# Patient Record
Sex: Female | Born: 1945 | Race: White | Hispanic: No | Smoking: Former smoker
Health system: Southern US, Academic
[De-identification: ages and names within clinical notes are randomized; demographics above are authoritative.]

## PROBLEM LIST (undated history)

## (undated) DIAGNOSIS — I10 Essential (primary) hypertension: Secondary | ICD-10-CM

## (undated) DIAGNOSIS — K219 Gastro-esophageal reflux disease without esophagitis: Secondary | ICD-10-CM

## (undated) DIAGNOSIS — I340 Nonrheumatic mitral (valve) insufficiency: Secondary | ICD-10-CM

## (undated) DIAGNOSIS — E785 Hyperlipidemia, unspecified: Secondary | ICD-10-CM

## (undated) DIAGNOSIS — I739 Peripheral vascular disease, unspecified: Secondary | ICD-10-CM

## (undated) HISTORY — DX: Hyperlipidemia, unspecified: E78.5

## (undated) HISTORY — PX: HX HYSTERECTOMY: SHX81

## (undated) HISTORY — PX: VENOUS THROMBECTOMY: SHX834

## (undated) HISTORY — PX: HX HIP REPLACEMENT: SHX124

## (undated) HISTORY — DX: Essential (primary) hypertension: I10

## (undated) HISTORY — DX: Nonrheumatic mitral (valve) insufficiency: I34.0

## (undated) HISTORY — PX: HX CATARACT REMOVAL: SHX102

## (undated) HISTORY — PX: HX SEPTOPLASTY: SHX37

## (undated) HISTORY — DX: Gastro-esophageal reflux disease without esophagitis: K21.9

## (undated) HISTORY — DX: Peripheral vascular disease, unspecified: I73.9

---

## 1993-10-11 ENCOUNTER — Other Ambulatory Visit (HOSPITAL_COMMUNITY): Payer: Self-pay | Admitting: Family Medicine

## 2021-04-22 ENCOUNTER — Other Ambulatory Visit (HOSPITAL_COMMUNITY): Payer: Self-pay | Admitting: Internal Medicine

## 2021-04-22 DIAGNOSIS — Z1231 Encounter for screening mammogram for malignant neoplasm of breast: Secondary | ICD-10-CM

## 2021-05-19 ENCOUNTER — Other Ambulatory Visit (INDEPENDENT_AMBULATORY_CARE_PROVIDER_SITE_OTHER): Payer: Self-pay | Admitting: NURSE PRACTITIONER

## 2021-05-19 DIAGNOSIS — I340 Nonrheumatic mitral (valve) insufficiency: Secondary | ICD-10-CM

## 2021-06-21 ENCOUNTER — Encounter (HOSPITAL_COMMUNITY): Payer: Self-pay

## 2021-06-21 ENCOUNTER — Inpatient Hospital Stay
Admission: RE | Admit: 2021-06-21 | Discharge: 2021-06-21 | Disposition: A | Discharge status: Home / Self Care | Payer: Medicare Other | Source: Ambulatory Visit | Attending: Internal Medicine | Admitting: Internal Medicine

## 2021-06-21 ENCOUNTER — Other Ambulatory Visit: Payer: Self-pay

## 2021-06-21 DIAGNOSIS — Z1231 Encounter for screening mammogram for malignant neoplasm of breast: Secondary | ICD-10-CM | POA: Insufficient documentation

## 2021-07-08 ENCOUNTER — Other Ambulatory Visit: Payer: Self-pay

## 2021-07-08 ENCOUNTER — Ambulatory Visit (INDEPENDENT_AMBULATORY_CARE_PROVIDER_SITE_OTHER): Payer: Medicare Other | Admitting: OTOLARYNGOLOGY

## 2021-07-08 ENCOUNTER — Encounter (INDEPENDENT_AMBULATORY_CARE_PROVIDER_SITE_OTHER): Payer: Self-pay | Admitting: OTOLARYNGOLOGY

## 2021-07-08 VITALS — Ht 63.0 in | Wt 105.0 lb

## 2021-07-08 DIAGNOSIS — H6123 Impacted cerumen, bilateral: Secondary | ICD-10-CM

## 2021-07-08 DIAGNOSIS — I739 Peripheral vascular disease, unspecified: Secondary | ICD-10-CM | POA: Insufficient documentation

## 2021-07-08 DIAGNOSIS — H9319 Tinnitus, unspecified ear: Secondary | ICD-10-CM

## 2021-07-08 NOTE — Procedures (Signed)
[facility], [facility]  [address]  [place] [address]    Procedure Note    Name: Lorraine Rojas MRN:  [id number]   Date: 07/08/2021 Age: 76 y.o.       [phone] - REMOVAL IMPACTED CERUMEN W/ INSTRUMENT, UNILATERAL (AMB ONLY-PD)  Performed by: Conchita Paris, DO  Authorized by: Conchita Paris, DO     Time Out:     Immediately before the procedure, a time out was called:  Yes    Patient verified:  Yes    Procedure Verified:  Yes    Site Verified:  Yes  Documentation:      Procedure: Cerumen cleaning  Pre-op Dx: Cerumen impaction      Bilateral EAC(s) examined under binocular microscopy.  Cerumen and/or debris was cleaned from the canal(s) using curettes, suction, and alligator forceps.  Patient tolerated procedure well.         Conchita Paris, DO

## 2021-07-08 NOTE — H&P (Signed)
[facility]  [facility]    Progress Note    Name: Lorraine Rojas MRN:  [id number]   Date: 07/08/2021 Age: 76 y.o.          Follow Up      Subjective:   Chief Complaint:   Hearing Loss (States having wax in ears and needing them cleaned out. Also states having decreased hearing and tinnitus.)       History of Present Illness:  Lorraine Rojas is a 76 y.o. old female who presents to the clinic for follow-up. Patient states that she has been having aural fullness left greater than right.  Patient denies otalgia or otorrhea.      Review of Systems     Physical Exam:     Vitals:    07/08/21 0850   Weight: 47.6 kg (105 lb)   Height: 1.6 m (5\' 3" )   BMI: 18.64      ENT Physical Exam  Constitutional  Appearance: patient appears well-developed, well-nourished and well-groomed,  Communication/Voice: communication appropriate for developmental age; vocal quality normal;  Head and Face  Appearance: head appears normal, face appears normal and face appears atraumatic;  Palpation: facial palpation normal;  Salivary: glands normal;  Ear  Hearing: intact;  Auricles: right auricle normal; left auricle normal;  External Mastoids: right external mastoid normal; left external mastoid normal;  Ear Canals: bilateral ear canals impacted cerumen observed;  Tympanic Membranes: right tympanic membrane normal; left tympanic membrane normal;  Nose  External Nose: nares patent bilaterally; external nose normal;  Internal Nose: nasal mucosa normal; septum normal; bilateral inferior turbinates normal;  Oral Cavity/Oropharynx  Lips: normal;  Teeth: normal;  Gums: gingiva normal;  Tongue: normal;  Oral mucosa: normal;  Hard palate: normal;  Neck  Neck: neck normal; neck palpation normal;  Thyroid: thyroid normal;  Respiratory  Inspection: breathing unlabored; normal breathing rate;  Lymphatic  Palpation: lymph nodes normal;  Neurovestibular  Mental Status: alert and oriented;  Psychiatric: mood normal; affect is appropriate;  Cranial  Nerves: cranial nerves intact;       Assessment and Plan:       ICD-10-CM    1. Tinnitus, unspecified laterality  H93.19 POCT HEARING/VISION/TYMPANOGRAM (AMB ONLY)     [facility]      2. Bilateral impacted cerumen  H61.23 [facility] - REMOVAL IMPACTED CERUMEN W/ INSTRUMENT, UNILATERAL (AMB ONLY-PD)        Orders Placed This Encounter   . [id number] - REMOVAL IMPACTED CERUMEN W/ INSTRUMENT, UNILATERAL (AMB ONLY-PD)   . Referral to AUDIOLOGY- [facility]   . POCT HEARING/VISION/TYMPANOGRAM (AMB ONLY)         Follow up:  Return for Follow up after audiogram.    [facility], DO

## 2021-09-06 ENCOUNTER — Encounter (INDEPENDENT_AMBULATORY_CARE_PROVIDER_SITE_OTHER): Payer: Self-pay | Admitting: OTOLARYNGOLOGY

## 2021-09-14 ENCOUNTER — Encounter (INDEPENDENT_AMBULATORY_CARE_PROVIDER_SITE_OTHER): Payer: Self-pay | Admitting: OTOLARYNGOLOGY

## 2021-09-14 ENCOUNTER — Other Ambulatory Visit: Payer: Self-pay

## 2021-09-14 ENCOUNTER — Ambulatory Visit (INDEPENDENT_AMBULATORY_CARE_PROVIDER_SITE_OTHER): Payer: Medicare Other | Admitting: OTOLARYNGOLOGY

## 2021-09-14 VITALS — Ht 63.0 in | Wt 105.0 lb

## 2021-09-14 DIAGNOSIS — H903 Sensorineural hearing loss, bilateral: Secondary | ICD-10-CM

## 2021-09-14 DIAGNOSIS — H6123 Impacted cerumen, bilateral: Secondary | ICD-10-CM

## 2021-09-14 DIAGNOSIS — H9319 Tinnitus, unspecified ear: Secondary | ICD-10-CM

## 2021-09-14 NOTE — Procedures (Signed)
[facility]  [address]  [place] [address]    Procedure Note    Name: Lorraine Rojas MRN:  [id number]   Date: 09/14/2021 Age: 76 y.o.       [phone] - REMOVAL IMPACTED CERUMEN W/ INSTRUMENT, UNILATERAL (AMB ONLY-PD)  Performed by: Conchita Paris, DO  Authorized by: Conchita Paris, DO     Time Out:     Immediately before the procedure, a time out was called:  Yes    Patient verified:  Yes    Procedure Verified:  Yes    Site Verified:  Yes  Documentation:      Procedure: Cerumen cleaning  Pre-op Dx: Cerumen impaction      Bilateral EAC(s) examined under binocular microscopy.  Cerumen and/or debris was cleaned from the canal(s) using curettes, suction, and alligator forceps.  Patient tolerated procedure well.         Conchita Paris, DO

## 2021-09-14 NOTE — H&P (Signed)
[facility]  [facility]    Progress Note    Name: NYASIAH MOFFET MRN:  [id number]   Date: 09/14/2021 Age: 76 y.o.          Follow Up      Subjective:   Chief Complaint:   Follow-up After Testing (Rc after audio)       History of Present Illness:  EARLYNE FEESER is a 76 y.o. old female who presents to the clinic for follow-up after audiogram.  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  Patient has unchanged audiogram from last audiogram.  No new complaints.    Review of Systems     Physical Exam:     Vitals:    09/14/21 1356   Weight: 47.6 kg (105 lb)   Height: 1.6 m (5\' 3" )   BMI: 18.64      ENT Physical Exam  Constitutional  Appearance: patient appears well-developed, well-nourished and well-groomed,  Communication/Voice: communication appropriate for developmental age; vocal quality normal;  Head and Face  Appearance: head appears normal, face appears normal and face appears atraumatic;  Palpation: facial palpation normal;  Salivary: glands normal;  Ear  Hearing: intact;  Auricles: right auricle normal; left auricle normal;  External Mastoids: right external mastoid normal; left external mastoid normal;  Ear Canals: bilateral ear canals impacted cerumen observed;  Tympanic Membranes: right tympanic membrane normal; left tympanic membrane normal;  Nose  External Nose: nares patent bilaterally; external nose normal;  Internal Nose: nasal mucosa normal; septum normal; bilateral inferior turbinates normal;  Oral Cavity/Oropharynx  Lips: normal;  Teeth: normal;  Gums: gingiva normal;  Tongue: normal;  Oral mucosa: normal;  Hard palate: normal;  Neck  Neck: neck normal; neck palpation normal;  Thyroid: thyroid normal;  Respiratory  Inspection: breathing unlabored; normal breathing rate;  Lymphatic  Palpation: lymph nodes normal;  Neurovestibular  Mental Status: alert and oriented;  Psychiatric: mood normal; affect is appropriate;  Cranial Nerves: cranial nerves intact;        Assessment and Plan:       ICD-10-CM    1. Tinnitus, unspecified laterality  H93.19       2. Bilateral impacted cerumen  H61.23 - REMOVAL IMPACTED CERUMEN W/ INSTRUMENT, UNILATERAL (AMB ONLY-PD)      3. 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 [id number] - REMOVAL IMPACTED CERUMEN W/ INSTRUMENT, UNILATERAL (AMB ONLY-PD)   Audiogram reviewed  Recommend hearing aids      Follow up:  Return in about 6 months (around 03/17/2022).    05/16/2022, DO

## 2021-11-11 ENCOUNTER — Encounter (INDEPENDENT_AMBULATORY_CARE_PROVIDER_SITE_OTHER): Payer: Self-pay | Admitting: NURSE PRACTITIONER

## 2021-11-17 ENCOUNTER — Other Ambulatory Visit: Payer: Self-pay

## 2021-11-17 ENCOUNTER — Ambulatory Visit (INDEPENDENT_AMBULATORY_CARE_PROVIDER_SITE_OTHER): Payer: Medicare Other | Admitting: NURSE PRACTITIONER

## 2021-11-17 ENCOUNTER — Encounter (INDEPENDENT_AMBULATORY_CARE_PROVIDER_SITE_OTHER): Payer: Self-pay | Admitting: NURSE PRACTITIONER

## 2021-11-17 VITALS — BP 147/62 | HR 63 | Ht 63.0 in | Wt 113.2 lb

## 2021-11-17 DIAGNOSIS — E785 Hyperlipidemia, unspecified: Secondary | ICD-10-CM

## 2021-11-17 DIAGNOSIS — I10 Essential (primary) hypertension: Secondary | ICD-10-CM

## 2021-11-17 DIAGNOSIS — R5383 Other fatigue: Secondary | ICD-10-CM

## 2021-11-17 DIAGNOSIS — I351 Nonrheumatic aortic (valve) insufficiency: Secondary | ICD-10-CM

## 2021-11-17 DIAGNOSIS — I340 Nonrheumatic mitral (valve) insufficiency: Secondary | ICD-10-CM

## 2021-11-17 NOTE — Progress Notes (Signed)
Cardiology Clinic [facility]    Name: Lorraine Rojas  Age: 76 y.o.  Date of Service: 11/17/2021    Primary Care Provider: No Pcp  Chief Complaint:   Chief Complaint   Patient presents with    Follow Up     Annual       Subjective:  The patient has no history of CAD.  Stress test and echocardiogram in February 2022 are unremarkable.  Echocardiogram showed LVH, mild LAE, EF 60 to 65% with grade 1 diastolic dysfunction.  Mild to moderate aortic regurgitation, mild PR, TR, and moderate MR.    11-13-20 The patient is here for routine f/u for valvular disease.  He denies any significant chest pains.  She continues to have shortness of breath with exertion.  She has to stop and rest when doing strenuous activity, but then she is able to continue.  No change from her baseline.  She does have lots of issues with vascular disease in her lower extremities.  She is seeing a specialist in [place].  She checks her blood pressure at home and numbers are usually stable, she has some anxiety related to checking her blood pressure so this affects her numbers sometimes. Labs July 2022 showed BUN 19, creatinine 1.1, sodium 140, potassium 3.9, total cholesterol 172, triglycerides 67, HDL 85, LDL 74.     11/17/21 The patient is here for yearly f/u.  She denies any chest pains or shortness of breath.  She does complain of some intermittent fatigue.  She states she is had this ever since she broke her hip a few years ago.  Sometimes she does well, but other times she has to stop and rest because she gets tired.  She did just get established with new PCP and has discussed her fatigue with him as well.  Lab work in January was stable showed total cholesterol 179, triglycerides 52, HDL 92, LDL 77, BUN 19, creatinine 0.97, sodium 141, potassium 4.2.      Past Medical History:  Past Medical History:   Diagnosis Date    Esophageal reflux     Essential hypertension     GERD (gastroesophageal reflux disease)     HLD (hyperlipidemia)      Mitral valve regurgitation     Peripheral vascular disease, unspecified (CMS HCC)          Social History:  Social History     Tobacco Use   Smoking Status Former    Types: Cigarettes    Quit date: 1975    Years since quitting: 48.7   Smokeless Tobacco Never      Social History     Substance and Sexual Activity   Alcohol Use Yes    Comment: holidays/special occasions      Social History     Substance and Sexual Activity   Drug Use Never      Current Medications:  Current Outpatient Medications   Medication Sig    atorvastatin (LIPITOR) 20 mg Oral Tablet 1 Tablet (20 mg total) Once a day    bethanechol chloride (URECHOLINE) 5 mg Oral Tablet Take 1 Tablet (5 mg total) by mouth Every night    calcium carbonate/vitamin D3 (CALCIUM 600 + D ORAL) Take by mouth Once a day    COQ10, UBIQUINOL, ORAL Take by mouth Once a day    dilTIAZem (CARDIZEM CD) 120 mg Oral Capsule, Sust. Release 24 hr Take 1 Capsule (120 mg total) by mouth Once a day  famotidine (PEPCID) 20 mg Oral Tablet Take 1 Tablet (20 mg total) by mouth Twice daily    losartan (COZAAR) 100 mg Oral Tablet Take 1 Tablet (100 mg total) by mouth Once a day    omeprazole (PRILOSEC) 40 mg Oral Capsule, Delayed Release(E.C.) Take 1 Capsule (40 mg total) by mouth Once a day     Allergies:  Allergies   Allergen Reactions    Biaxin [Clarithromycin]       Review of Systems:  Complete ROS was performed and otherwise negative unless noted in HPI.      Vital Signs:  Vitals:    11/17/21 0912   BP: (!) 147/62   Pulse: 63   SpO2: 98%   Weight: 51.4 kg (113 lb 4 oz)   Height: 1.6 m (5\' 3" )   BMI: 20.1      Physical Exam:  General: Pt resting comfortably in no acute distress and appears stated age.    Neck: No JVD, no carotid bruit. Neck supple, symmetrical, trachea midline.   Lungs:  Normal respiratory effort, lungs clear to auscultation bilaterally.    Cardiovascular:  Regular rate and rhythm.  Normal S1 and S2 without murmur, gallop, or rub.  Abdomen: Soft, non-tender and  bowel sounds normal.    Extremities: Extremities normal, atraumatic, no cyanosis or edema.    Neurologic: Alert and oriented x3.       Assessment:    Mitral valve insufficiency, unspecified etiology    Fatigue, unspecified type    Aortic insufficiency    Hypertension, unspecified type    Hyperlipidemia, unspecified hyperlipidemia type      Plan:   Overall stable.  Her symptoms of fatigue do not sound cardiac at this point.  Recent lab work was stable.  Will repeat echo, this was ordered for prior to this visit, but did not transfer into the new system.  As long as this is stable, return in 1 year for routine f/u.      Orders placed this visit:  Orders Placed This Encounter    EKG (In-Clinic Today)    TRANSTHORACIC ECHOCARDIOGRAM - ADULT       Lorraine Rojas is to return to clinic for follow up with the understanding that should symptoms change or worsen she is to call the office or go to the closest emergency department for evaluation.    , APRN,FNP-BC    A portion of this documentation may have been generated using MMODAL voice recognition software and may contain syntax/voice recognition errors.

## 2021-11-18 ENCOUNTER — Telehealth (INDEPENDENT_AMBULATORY_CARE_PROVIDER_SITE_OTHER): Payer: Self-pay | Admitting: INTERVENTIONAL CARDIOLOGY

## 2021-11-18 NOTE — Telephone Encounter (Signed)
Pt called and informed that her ECHO is scheduled for Oct.3,2023 at 100pm at [facility].Pt voiced understanding.

## 2021-12-07 ENCOUNTER — Other Ambulatory Visit: Payer: Self-pay

## 2021-12-07 ENCOUNTER — Inpatient Hospital Stay
Admission: RE | Admit: 2021-12-07 | Discharge: 2021-12-07 | Disposition: A | Discharge status: Home / Self Care | Payer: Medicare Other | Source: Ambulatory Visit | Attending: NURSE PRACTITIONER | Admitting: NURSE PRACTITIONER

## 2021-12-07 DIAGNOSIS — I340 Nonrheumatic mitral (valve) insufficiency: Secondary | ICD-10-CM | POA: Insufficient documentation

## 2021-12-27 ENCOUNTER — Encounter (INDEPENDENT_AMBULATORY_CARE_PROVIDER_SITE_OTHER): Payer: Self-pay | Admitting: OTOLARYNGOLOGY

## 2022-01-12 LAB — ECG W INTERP (AMB USE ONLY)(MUSE,IN CLINIC)
Atrial Rate: 65 {beats}/min
Calculated P Axis: 53 degrees
Calculated R Axis: 78 degrees
Calculated T Axis: 71 degrees
PR Interval: 156 ms
QRS Duration: 78 ms
QT Interval: 422 ms
QTC Calculation: 438 ms
Ventricular rate: 65 {beats}/min

## 2022-02-17 ENCOUNTER — Encounter (INDEPENDENT_AMBULATORY_CARE_PROVIDER_SITE_OTHER): Payer: Self-pay | Admitting: Internal Medicine

## 2022-03-01 ENCOUNTER — Ambulatory Visit (INDEPENDENT_AMBULATORY_CARE_PROVIDER_SITE_OTHER): Payer: Medicare Other | Admitting: OTOLARYNGOLOGY

## 2022-03-01 ENCOUNTER — Other Ambulatory Visit: Payer: Self-pay

## 2022-03-01 ENCOUNTER — Encounter (INDEPENDENT_AMBULATORY_CARE_PROVIDER_SITE_OTHER): Payer: Self-pay | Admitting: OTOLARYNGOLOGY

## 2022-03-01 VITALS — Ht 63.0 in | Wt 113.0 lb

## 2022-03-01 DIAGNOSIS — H6123 Impacted cerumen, bilateral: Secondary | ICD-10-CM

## 2022-03-01 DIAGNOSIS — K219 Gastro-esophageal reflux disease without esophagitis: Secondary | ICD-10-CM

## 2022-03-01 DIAGNOSIS — H903 Sensorineural hearing loss, bilateral: Secondary | ICD-10-CM

## 2022-03-01 DIAGNOSIS — H9319 Tinnitus, unspecified ear: Secondary | ICD-10-CM

## 2022-03-01 NOTE — H&P (Signed)
[facility]  [facility]    Progress Note    Name: Lorraine Rojas MRN:  [id number]   Date: 03/01/2022 Age: 76 y.o.          Follow Up      Subjective:   Chief Complaint:   Follow Up 6 Months (6 month rc on ears. States having wax in ears that is causing decreased hearing)       History of Present Illness:  Lorraine Rojas is a 76 y.o. old female who presents to the clinic for follow-up. Patient states that she has been having some cerumen build up with decreased hearing. She is still having tinnitus. Patient states that reflux is well controlled.  Review of Systems     Physical Exam:     Vitals:    03/01/22 0828   Weight: 51.3 kg (113 lb)   Height: 1.6 m (5\' 3" )   BMI: 20.06      ENT Physical Exam  Constitutional  Appearance: patient appears well-developed, well-nourished and well-groomed,  Communication/Voice: communication appropriate for developmental age; vocal quality normal;  Head and Face  Appearance: head appears normal, face appears normal and face appears atraumatic;  Palpation: facial palpation normal;  Salivary: glands normal;  Ear  Hearing: intact;  Auricles: right auricle normal; left auricle normal;  External Mastoids: right external mastoid normal; left external mastoid normal;  Ear Canals: bilateral ear canals impacted cerumen observed;  Tympanic Membranes: right tympanic membrane normal; left tympanic membrane normal;  Nose  External Nose: nares patent bilaterally; external nose normal;  Internal Nose: nasal mucosa normal; septum normal; bilateral inferior turbinates normal;  Oral Cavity/Oropharynx  Lips: normal;  Teeth: normal;  Gums: gingiva normal;  Tongue: normal;  Oral mucosa: normal;  Hard palate: normal;  Neck  Neck: neck normal; neck palpation normal;  Thyroid: thyroid normal;  Respiratory  Inspection: breathing unlabored; normal breathing rate;  Lymphatic  Palpation: lymph nodes normal;  Neurovestibular  Mental Status: alert and oriented;  Psychiatric: mood normal; affect is  appropriate;  Cranial Nerves: cranial nerves intact;       Assessment and Plan:       ICD-10-CM    1. Tinnitus, unspecified laterality  H93.19       2. Bilateral impacted cerumen  H61.23 - REMOVAL IMPACTED CERUMEN W/ INSTRUMENT, UNILATERAL (AMB ONLY-PD)      3. Sensorineural hearing loss (SNHL) of both ears  H90.3       4. 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  Cerumen removed  Patient is considering hearing aids.      Follow up:  Return in about 4 months (around 07/01/2022).    07/03/2022, DO

## 2022-03-01 NOTE — Procedures (Signed)
[facility]  [address]  [place] [address]    Procedure Note    Name: Lorraine Rojas MRN:  [id number]   Date: 03/01/2022 Age: 76 y.o.  DOB:   Aug 02, 1945       26378 - REMOVAL IMPACTED CERUMEN W/ INSTRUMENT, UNILATERAL (AMB ONLY-PD)    Performed by: Conchita Paris, DO  Authorized by: Conchita Paris, DO    Time Out:     Immediately before the procedure, a time out was called:  Yes    Patient verified:  Yes    Procedure Verified:  Yes    Site Verified:  Yes  Documentation:      Procedure: Cerumen cleaning  Pre-op Dx: Cerumen impaction      Bilateral EAC(s) examined under binocular microscopy.  Cerumen and/or debris was cleaned from the canal(s) using curettes, suction, and alligator forceps.  Patient tolerated procedure well.        Conchita Paris, DO

## 2022-03-14 ENCOUNTER — Encounter (INDEPENDENT_AMBULATORY_CARE_PROVIDER_SITE_OTHER): Payer: Self-pay | Admitting: OTOLARYNGOLOGY

## 2022-03-20 ENCOUNTER — Other Ambulatory Visit: Payer: Self-pay

## 2022-03-20 ENCOUNTER — Encounter (HOSPITAL_BASED_OUTPATIENT_CLINIC_OR_DEPARTMENT_OTHER): Payer: Self-pay

## 2022-03-20 ENCOUNTER — Emergency Department
Admission: EM | Admit: 2022-03-20 | Discharge: 2022-03-20 | Disposition: A | Discharge status: Home / Self Care | Payer: Medicare Other | Attending: Emergency Medicine | Admitting: Emergency Medicine

## 2022-03-20 DIAGNOSIS — Z87891 Personal history of nicotine dependence: Secondary | ICD-10-CM | POA: Insufficient documentation

## 2022-03-20 DIAGNOSIS — I83892 Varicose veins of left lower extremities with other complications: Secondary | ICD-10-CM | POA: Insufficient documentation

## 2022-03-20 DIAGNOSIS — I83899 Varicose veins of unspecified lower extremities with other complications: Secondary | ICD-10-CM

## 2022-03-20 DIAGNOSIS — Z4801 Encounter for change or removal of surgical wound dressing: Secondary | ICD-10-CM | POA: Insufficient documentation

## 2022-03-20 DIAGNOSIS — Z5189 Encounter for other specified aftercare: Secondary | ICD-10-CM

## 2022-03-20 MED ORDER — SILVER NITRATE APPLICATORS 75 %-25 % TOPICAL STICK
CUTANEOUS | Status: AC
Start: 2022-03-20 — End: 2022-03-20
  Filled 2022-03-20: qty 1

## 2022-03-20 MED ORDER — HYDROCODONE 5 MG-ACETAMINOPHEN 325 MG TABLET
2.0000 | ORAL_TABLET | ORAL | Status: AC
Start: 2022-03-20 — End: 2022-03-20
  Administered 2022-03-20: 2 via ORAL

## 2022-03-20 MED ORDER — HYDROCODONE 5 MG-ACETAMINOPHEN 325 MG TABLET
ORAL_TABLET | ORAL | Status: AC
Start: 2022-03-20 — End: 2022-03-20
  Filled 2022-03-20: qty 2

## 2022-03-20 MED ORDER — SILVER NITRATE APPLICATORS 75 %-25 % TOPICAL STICK
1.0000 | CUTANEOUS | Status: AC
Start: 2022-03-20 — End: 2022-03-20
  Administered 2022-03-20: 1 via TOPICAL

## 2022-03-20 NOTE — Discharge Instructions (Signed)
KEEP PRESSURE BANDAGE FOR 8-12 HOURS

## 2022-03-20 NOTE — ED Attending Note (Addendum)
[facility] emergency department         HISTORY OF PRESENT ILLNESS     Date:  03/20/2022  Patient's Name:  Lorraine Rojas  Date of Birth:  11/17/1945    PATIENT IS A 76 YEAR WITH HISTORY OF BIOPSY LEFT FOOT 2 WEEKS NOW WITH BLEEDING FROM THE WOUND SITE ONSET TONIGHT AFTER TAKING OFF HER SOCK NO OTHER INJURY        Review of Systems     Review of Systems   Constitutional: Negative.    HENT: Negative.     Cardiovascular: Negative.    Gastrointestinal: Negative.    Genitourinary: Negative.    Musculoskeletal: Negative.    Neurological: Negative.    Hematological: Negative.    Psychiatric/Behavioral: Negative.     All other systems reviewed and are negative.      Previous History     Past Medical History:  Past Medical History:   Diagnosis Date    Esophageal reflux     Essential hypertension     GERD (gastroesophageal reflux disease)     HLD (hyperlipidemia)     Mitral valve regurgitation     Peripheral vascular disease, unspecified (CMS HCC)        Past Surgical History:  Past Surgical History:   Procedure Laterality Date    Hx hysterectomy         Social History:  Social History     Tobacco Use    Smoking status: Former     Types: Cigarettes     Quit date: 1975     Years since quitting: 49.0    Smokeless tobacco: Never   Vaping Use    Vaping Use: Never used   Substance Use Topics    Alcohol use: Yes     Comment: holidays/special occasions    Drug use: Never     Social History     Substance and Sexual Activity   Drug Use Never       Family History:  Family History   Problem Relation Age of Onset    Diabetes Mother     Heart Attack Father     No Known Problems Sister     No Known Problems Brother     No Known Problems Maternal Aunt     No Known Problems Maternal Uncle     No Known Problems Paternal Aunt     No Known Problems Paternal Uncle     No Known Problems Maternal Grandmother     No Known Problems Maternal Grandfather     No Known Problems Paternal Grandmother     No  Known Problems Paternal Grandfather     No Known Problems Daughter     No Known Problems Son     No Known Problems Other        Medication History:  Current Outpatient Medications   Medication Sig    atorvastatin (LIPITOR) 20 mg Oral Tablet 1 Tablet (20 mg total) Once a day    bethanechol chloride (URECHOLINE) 5 mg Oral Tablet Take 1 Tablet (5 mg total) by mouth Every night    calcium carbonate/vitamin D3 (CALCIUM 600 + D ORAL) Take by mouth Once a day    COQ10, UBIQUINOL, ORAL Take by mouth Once a day    dilTIAZem (CARDIZEM CD) 120 mg Oral Capsule, Sust. Release 24 hr Take 1 Capsule (120 mg total) by mouth Once a day    famotidine (PEPCID) 20 mg Oral  Tablet Take 1 Tablet (20 mg total) by mouth Twice daily    losartan (COZAAR) 100 mg Oral Tablet Take 1 Tablet (100 mg total) by mouth Once a day    omeprazole (PRILOSEC) 40 mg Oral Capsule, Delayed Release(E.C.) Take 1 Capsule (40 mg total) by mouth Once a day       Allergies:  Allergies   Allergen Reactions    Biaxin [Clarithromycin]        Physical Exam     Vitals:    BP (!) 143/69   Pulse 87   Temp 36.4 C (97.6 F)   Resp 20   Ht 1.6 m (5\' 3" )   Wt 47.6 kg (105 lb)   SpO2 100%   BMI 18.60 kg/m           Physical Exam  Vitals and nursing note reviewed.   Constitutional:       General: She is not in acute distress.     Appearance: She is well-developed.   HENT:      Head: Normocephalic and atraumatic.   Eyes:      Conjunctiva/sclera: Conjunctivae normal.   Cardiovascular:      Rate and Rhythm: Normal rate and regular rhythm.      Heart sounds: No murmur heard.  Pulmonary:      Effort: Pulmonary effort is normal. No respiratory distress.      Breath sounds: Normal breath sounds.   Abdominal:      Palpations: Abdomen is soft.      Tenderness: There is no abdominal tenderness.   Musculoskeletal:         General: No swelling.      Cervical back: Neck supple.        Feet:    Feet:      Comments: BLEEDING BIOPSY SITE AREA  Skin:     General: Skin is warm and dry.       Capillary Refill: Capillary refill takes less than 2 seconds.   Neurological:      General: No focal deficit present.      Mental Status: She is alert and oriented to person, place, and time.   Psychiatric:         Mood and Affect: Mood normal.         Diagnostic Studies/Treatment     Medications:  Medications Administered in the ED   silver nitrate applicators 16-10% stick (1 Stick Apply Topically Given 03/20/22 0300)   HYDROcodone-acetaminophen (NORCO) 5-325 mg per tablet (2 Tablets Oral Given 03/20/22 0222)       New Prescriptions    No medications on file       Labs:    No results found for this or any previous visit (from the past 12 hour(s)).     Radiology:  None    No orders to display       ECG:  NONE            Differential diagnosis  BLEEDING BIOPSY SITE, WOUND CHECK    Course/Disposition/Plan     Course:     ED Course as of 03/20/22 0226   Sun Mar 20, 2022   0204 PATIENT WITH A SMALL AREA ON HER LEFT FOOT VARICOSE VEIN ACTIVELY BLEEDING.  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

## 2022-03-20 NOTE — ED Triage Notes (Signed)
Pt states she had a biopsy done on top of left foot a few weeks ago and tonight she was removing sock and ripped scab off and heavy bleeding reported. Pt states blood shot out and they finally got it slowed down with "blood stop powder" and bandage, but unsure if this bandage will hold it. Denies any new injury.

## 2022-03-20 NOTE — ED Nurses Note (Signed)
Pt verbalized that pain is improved. Current pain rated at a 2/10 on the pain scale. Pt states that she is comfortable with discharge at this time. DC instructions discussed with verbal teach back to confirm understanding. Pt dc'd to home with friend via wheelchair.

## 2022-03-20 NOTE — ED Nurses Note (Signed)
Pt left food dressed with nonadherent gauze, kerlex, and coband. Caprefill good. Pt can feel toes adequately. Plan of care ongoing at this time.

## 2022-06-20 IMAGING — MR MRI SHOULDER RT W/O CONTRAST
6 series · 39 of 40 positions shown · non-contrast
Comparison: None available.

﻿EXAM:  [id number]   MRI SHOULDER RT W/O CONTRAST
INDICATION: Right shoulder pain, osteoarthritis, weakness, impingement, several falls recently.
TECHNIQUE: Noncontrast multiplanar, multisequence MRI was performed.

[Series 6: T1 · oblique · right · 3.5mm · 0.33mm/px · 7 of 18 slices shown]
[im 1/18]
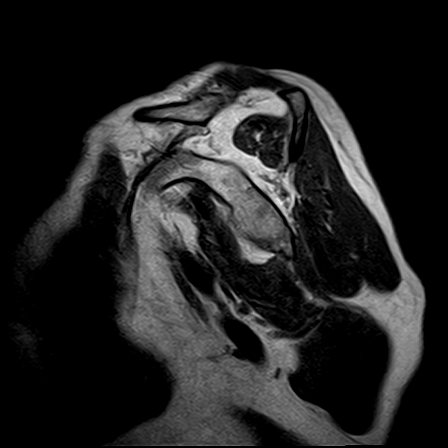
[im 3/18]
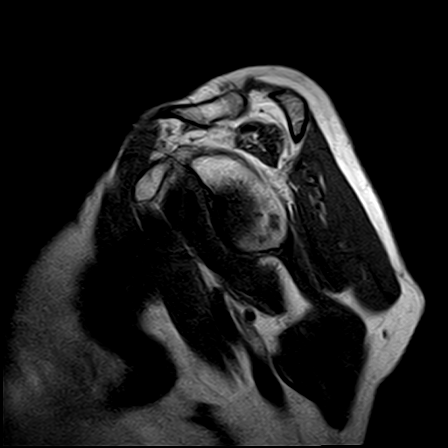
[im 6/18]
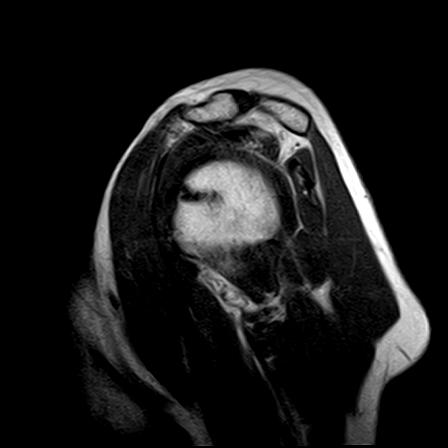
[im 9/18]
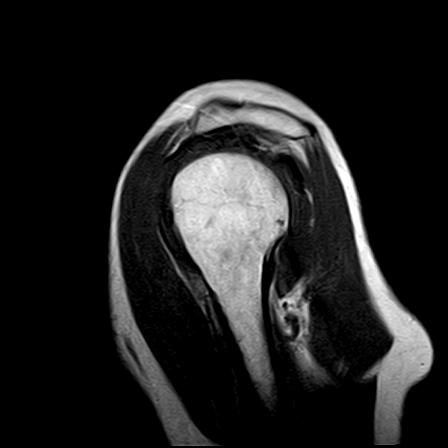
[im 12/18]
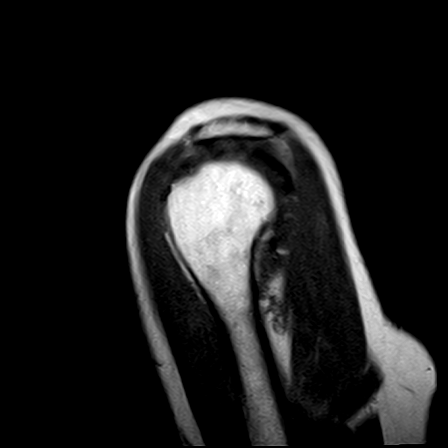
[im 15/18]
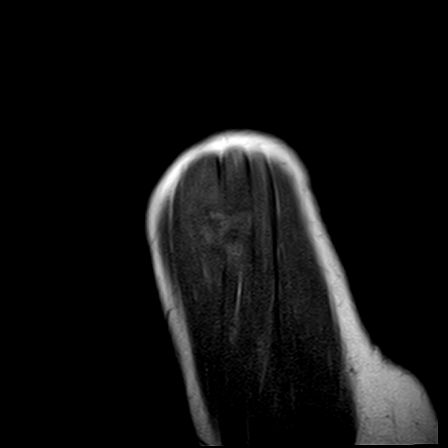
[im 18/18]
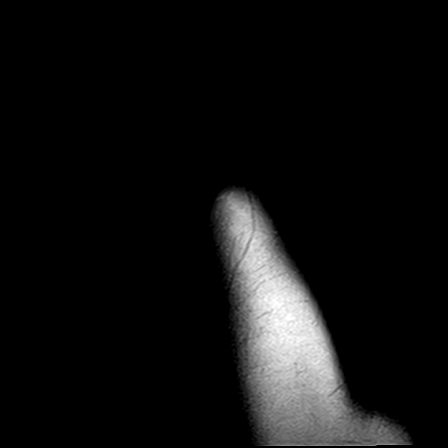

[Series 7: STIR · oblique · right · 3.5mm · 0.47mm/px · 7 of 18 slices shown (1 of 2)]
[im 1/18]
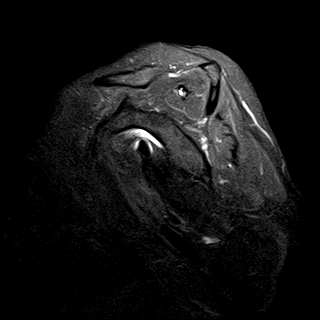
[im 3/18]
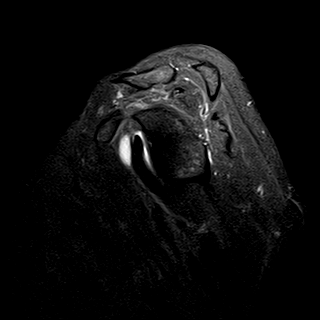
[im 6/18]
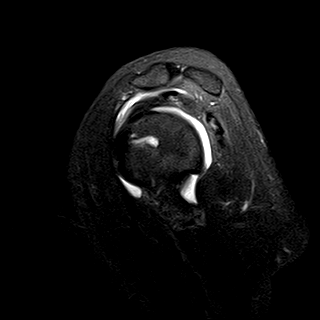
[im 9/18]
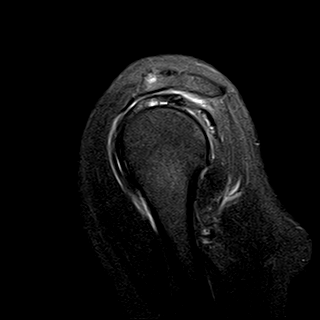
[im 12/18]
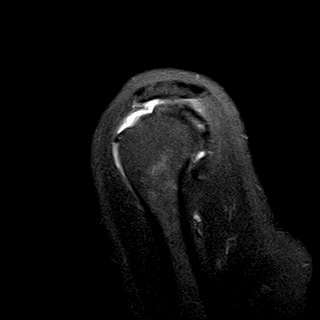
[im 15/18]
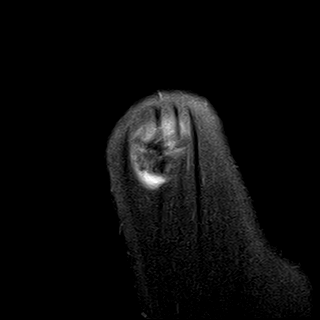
[im 18/18]
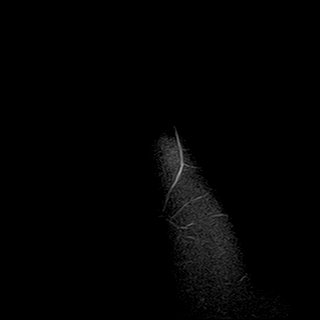

[Series 8: PD fat-sat · axial · right · 4.0mm · 0.50mm/px · z∈[-9,+67]mm · 6 of 18 slices shown (1 of 2)]
[im 1/18]
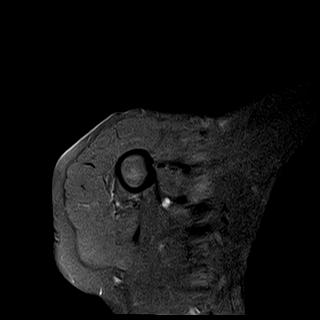
[im 4/18]
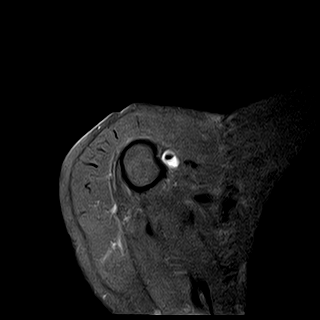
[im 7/18]
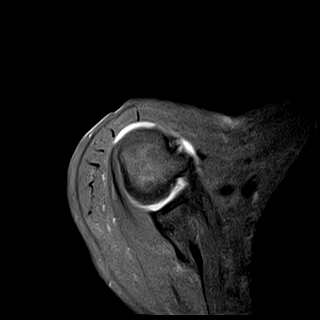
[im 11/18]
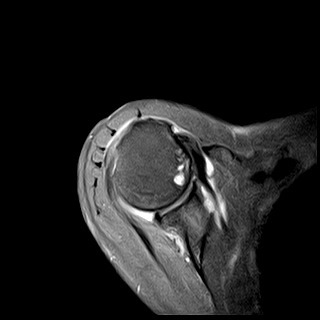
[im 14/18]
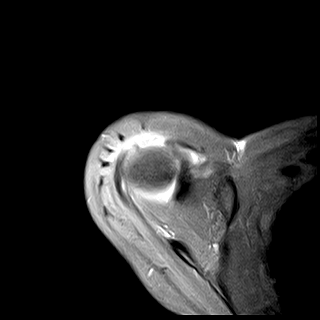
[im 18/18]
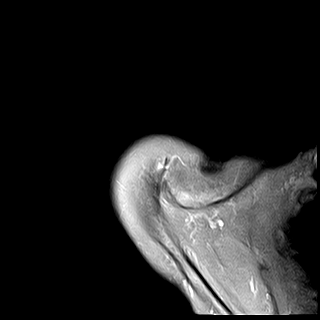

[Series 9: T2 fat-sat · axial · right · 4.0mm · 0.42mm/px · z∈[-22,+80]mm · 8 of 24 slices shown]
[im 1/24]
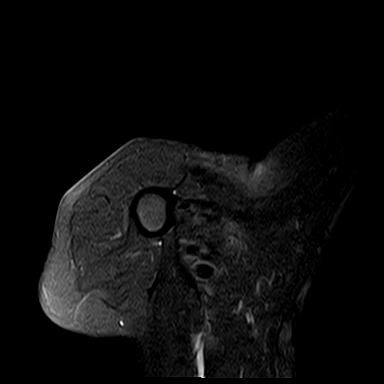
[im 4/24]
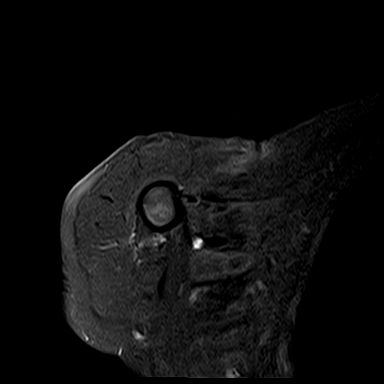
[im 7/24]
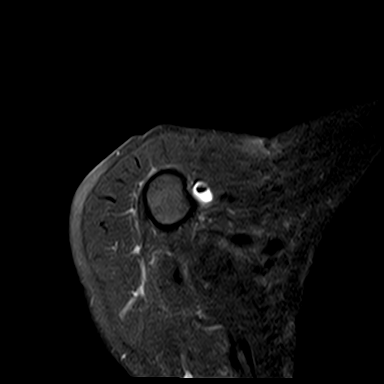
[im 10/24]
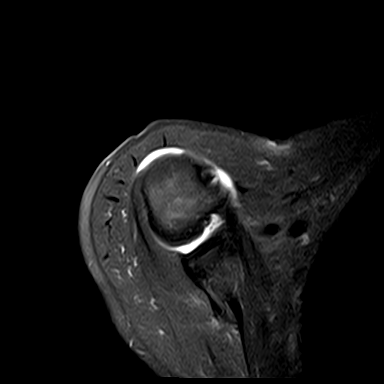
[im 14/24]
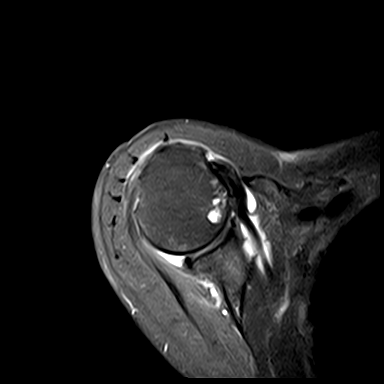
[im 17/24]
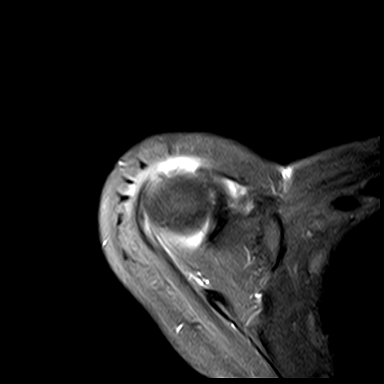
[im 20/24]
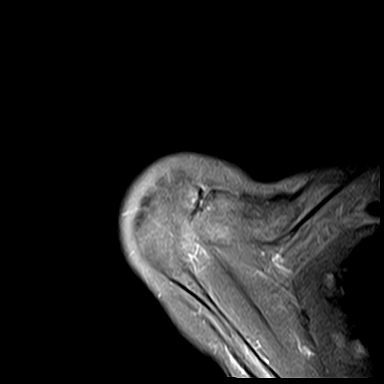
[im 24/24]
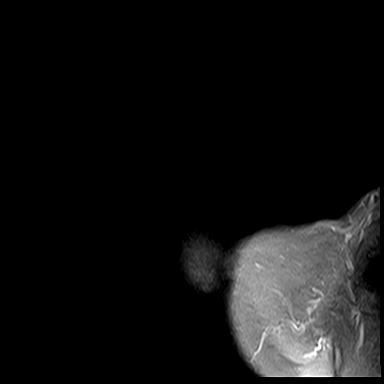

[Series 10: PD fat-sat · oblique · right · 3.5mm · 0.47mm/px · 6 of 18 slices shown (2 of 2)]
[im 1/18]
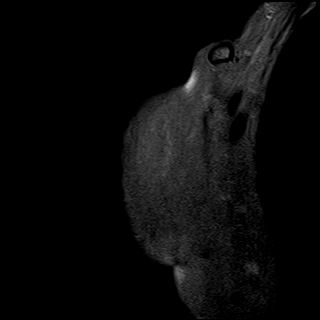
[im 4/18]
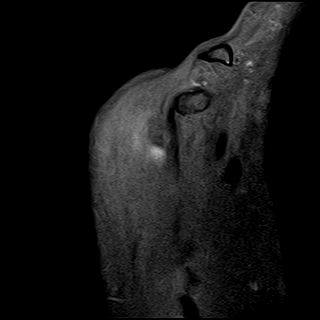
[im 7/18]
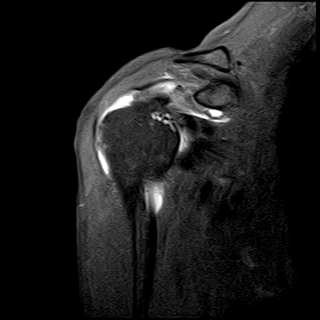
[im 11/18]
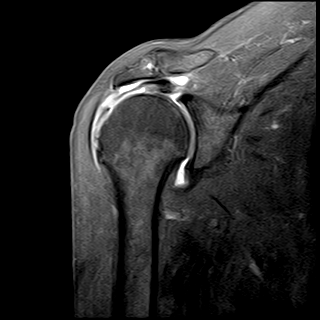
[im 14/18]
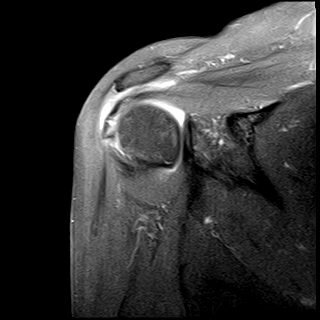
[im 18/18]
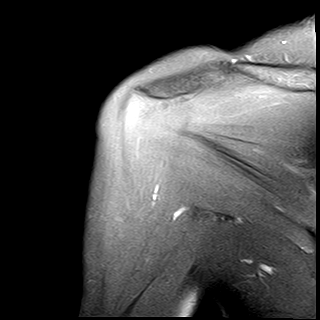

[Series 11: STIR · oblique · right · 3.5mm · 0.47mm/px · 5 of 18 slices shown (2 of 2)]
[im 1/18]
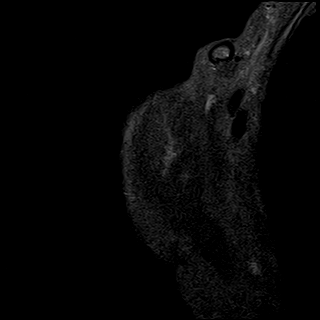
[im 4/18]
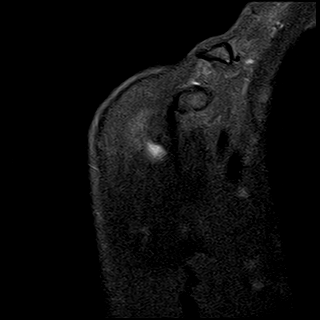
[im 7/18]
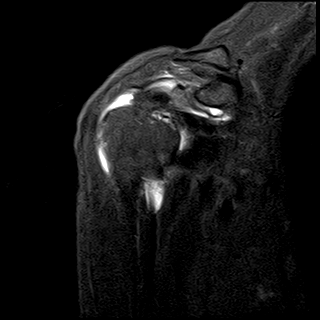
[im 11/18]
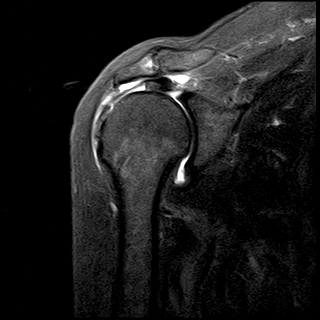
[im 14/18]
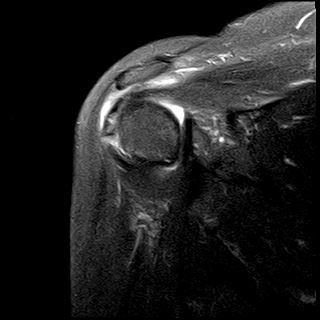

[39 of 40 positions shown; findings below may reference images not displayed]

FINDINGS: There is a small amount of fluid in the glenohumeral joint and in the subacromial-subdeltoid bursa.  

There is a complete tear of the supraspinatus tendon with approximately 3 cm of retraction.  There is also a high-grade, subtotal tear of the infraspinatus tendon.  

The subacromial space is reduced, and the acromion is down sloping.  

There is subscapularis tendinosis as well as mild tendinosis involving the biceps tendon.  

The glenoid labrum is intact.  There is degenerative cyst formation in the humeral head.  There is no fracture or dislocation.  Mild degenerative changes are noted involving the acromioclavicular joint.
IMPRESSION: Massive rotator cuff tear.

## 2022-07-01 ENCOUNTER — Other Ambulatory Visit (HOSPITAL_COMMUNITY): Payer: Self-pay | Admitting: Family Medicine

## 2022-07-01 DIAGNOSIS — Z1231 Encounter for screening mammogram for malignant neoplasm of breast: Secondary | ICD-10-CM

## 2022-07-04 ENCOUNTER — Ambulatory Visit (INDEPENDENT_AMBULATORY_CARE_PROVIDER_SITE_OTHER): Payer: Medicare Other | Admitting: OTOLARYNGOLOGY

## 2022-07-04 ENCOUNTER — Other Ambulatory Visit: Payer: Self-pay

## 2022-07-04 ENCOUNTER — Encounter (INDEPENDENT_AMBULATORY_CARE_PROVIDER_SITE_OTHER): Payer: Self-pay | Admitting: OTOLARYNGOLOGY

## 2022-07-04 VITALS — Ht 63.0 in | Wt 105.0 lb

## 2022-07-04 DIAGNOSIS — K219 Gastro-esophageal reflux disease without esophagitis: Secondary | ICD-10-CM

## 2022-07-04 DIAGNOSIS — H9319 Tinnitus, unspecified ear: Secondary | ICD-10-CM

## 2022-07-04 DIAGNOSIS — H6123 Impacted cerumen, bilateral: Secondary | ICD-10-CM

## 2022-07-04 DIAGNOSIS — H903 Sensorineural hearing loss, bilateral: Secondary | ICD-10-CM

## 2022-07-04 DIAGNOSIS — J300 Vasomotor rhinitis: Secondary | ICD-10-CM

## 2022-07-04 MED ORDER — IPRATROPIUM BROMIDE 21 MCG (0.03 %) NASAL SPRAY
2.0000 | Freq: Two times a day (BID) | NASAL | 4 refills | Status: AC | PRN
Start: 2022-07-04 — End: ?

## 2022-07-04 NOTE — Procedures (Signed)
[facility]  [address]  [place] [address]    Procedure Note    Name: TYLIA EWELL MRN:  [id number]   Date: 07/04/2022 DOB:  09-15-1945 (76 y.o.)         [phone] - REMOVAL IMPACTED CERUMEN W/ INSTRUMENT, UNILATERAL (AMB ONLY-PD)    Performed by: Conchita Paris, DO  Authorized by: Conchita Paris, DO    Time Out:     Immediately before the procedure, a time out was called:  Yes    Patient verified:  Yes    Procedure Verified:  Yes    Site Verified:  Yes  Documentation:      Procedure: Cerumen cleaning  Pre-op Dx: Cerumen impaction      Bilateral EAC(s) examined under binocular microscopy.  Cerumen and/or debris was cleaned from the canal(s) using curettes, suction, and alligator forceps.  Patient tolerated procedure well.    56213 - LARYNGOSCOPY, FLEXIBLE DIAGNOSTIC (AMB ONLY)    Performed by: Conchita Paris, DO  Authorized by: Conchita Paris, DO    Time Out:     Immediately before the procedure, a time out was called:  Yes    Patient verified:  Yes    Procedure Verified:  Yes    Site Verified:  Yes  Documentation:      Indications for procedure: GERD / LPR management    Anesthesia: Oxymetazoline nasal spray    Description: The flexible endoscope was gently introduced into the nostril and passed along the floor of the nose to the nasopharynx. Adenoid was minimal and eustachian tubes normal. The retropalatal airway was patent.    The endoscope was passed to the oropharynx. Base of tongue displayed normal lingual tonsils, patent valelulla, and sharply defined upright epiglottis. Retrolingual airway was patent.    The larynx displayed normal true vocal cords with good mobility. False cords were normal. Arytenoid mucosa was pink with no edema.     The piriform recesses were symmetric without secretion. The hypopharynx was symmetric without lesion.    Findings: Laryngopharyngeal Reflux    The patient tolerated the procedure well.                 Conchita Paris, DO

## 2022-07-04 NOTE — H&P (Signed)
[facility]  [address]  [place] [address]    Return Patient Visit    Name: Lorraine Rojas MRN:  [id number]   Date: 07/04/2022 DOB: 1945-11-20 (76 y.o.)       Referring Provider:  No ref. provider found    Reason for Visit:   Chief Complaint   Patient presents with    Follow Up 4 Months     4 month rc on ears. States no complaints noted.        History of Present Illness:  Lorraine Rojas is a 77 y.o. female who is FU on ears. Her ears feel a little clogged. She c/o a runny nose with meals. Tolerating Pepcid and omeprazole which work very well.       Patient History:  Patient Active Problem List   Diagnosis    Peripheral vascular disease, unspecified (CMS HCC)     Current Outpatient Medications   Medication Sig    atorvastatin (LIPITOR) 20 mg Oral Tablet 1 Tablet (20 mg total) Once a day    calcium carbonate/vitamin D3 (CALCIUM 600 + D ORAL) Take by mouth Once a day    COQ10, UBIQUINOL, ORAL Take by mouth Once a day    dilTIAZem (CARDIZEM CD) 120 mg Oral Capsule, Sust. Release 24 hr Take 1 Capsule (120 mg total) by mouth Once a day    famotidine (PEPCID) 20 mg Oral Tablet Take 1 Tablet (20 mg total) by mouth Twice daily    ipratropium bromide (ATROVENT) 21 mcg (0.03 %) Nasal nasal spray Administer 2 Sprays into affected nostril(s) Twice per day as needed    losartan (COZAAR) 100 mg Oral Tablet Take 1 Tablet (100 mg total) by mouth Once a day    omeprazole (PRILOSEC) 40 mg Oral Capsule, Delayed Release(E.C.) Take 1 Capsule (40 mg total) by mouth Once a day      Allergies   Allergen Reactions    Biaxin [Clarithromycin]      Past Medical History:   Diagnosis Date    Esophageal reflux     Essential hypertension     GERD (gastroesophageal reflux disease)     HLD (hyperlipidemia)     Mitral valve regurgitation     Peripheral vascular disease, unspecified (CMS HCC)      Past Surgical History:   Procedure Laterality Date    HX HYSTERECTOMY       Family Medical History:       Problem Relation (Age  of Onset)    Diabetes Mother    Heart Attack Father    No Known Problems Sister, Brother, Maternal Aunt, Maternal Uncle, Paternal Aunt, Paternal Uncle, Maternal Grandmother, Maternal Grandfather, Paternal Grandmother, Paternal Grandfather, Daughter, Son, Other            Social History     Tobacco Use    Smoking status: Former     Current packs/day: 0.00     Types: Cigarettes     Quit date: 1975     Years since quitting: 49.3    Smokeless tobacco: Never   Vaping Use    Vaping status: Never Used   Substance Use Topics    Alcohol use: Yes     Comment: holidays/special occasions    Drug use: Never       Review of Systems:  Review of Systems    Physical Exam:  Ht 1.6 m (5\' 3" )   Wt 47.6 kg (105 lb)   BMI 18.60 kg/m  ENT Physical Exam  Constitutional  Appearance: patient appears well-developed, well-nourished and well-groomed,  Communication/Voice: communication appropriate for developmental age; vocal quality normal;  Head and Face  Appearance: head appears normal, face appears normal and face appears atraumatic;  Palpation: facial palpation normal;  Salivary: glands normal;  Ear  Hearing: intact;  Auricles: right auricle normal; left auricle normal;  External Mastoids: right external mastoid normal; left external mastoid normal;  Ear Canals: bilateral ear canals impacted cerumen observed;  Tympanic Membranes: right tympanic membrane normal; left tympanic membrane normal;  Nose  External Nose: nares patent bilaterally; external nose normal;  Internal Nose: nasal mucosa normal; septum normal; bilateral inferior turbinates normal;  Oral Cavity/Oropharynx  Lips: normal;  Teeth: normal;  Gums: gingiva normal;  Tongue: normal;  Oral mucosa: normal;  Hard palate: normal;  Neck  Neck: neck normal; neck palpation normal;  Thyroid: thyroid normal;  Respiratory  Inspection: breathing unlabored; normal breathing rate;  Lymphatic  Palpation: lymph nodes normal;  Neurovestibular  Mental Status: alert and  oriented;  Psychiatric: mood normal; affect is appropriate;  Cranial Nerves: cranial nerves intact;       Assessment:  ENCOUNTER DIAGNOSES     ICD-10-CM   1. Tinnitus, unspecified laterality  H93.19   2. Bilateral impacted cerumen  H61.23   3. 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  AU cerumen debrided. Cont omeprazole and Pepcid, stable. Rx Atrovent 0.03%.   Orders Placed This Encounter    [phone] - REMOVAL IMPACTED CERUMEN W/ INSTRUMENT, UNILATERAL (AMB ONLY-PD)    31575 - LARYNGOSCOPY, FLEXIBLE DIAGNOSTIC (AMB ONLY)    ipratropium bromide (ATROVENT) 21 mcg (0.03 %) Nasal nasal spray     Return in about 6 months (around 01/03/2023).    Marcelline Deist, PA-C  The advanced practice clinician's documentation was reviewed/amended in its entirety with the assessment and plan portion completely performed independently by me during this separate encounter.

## 2022-07-14 ENCOUNTER — Encounter (HOSPITAL_COMMUNITY): Payer: Self-pay

## 2022-07-14 ENCOUNTER — Other Ambulatory Visit: Payer: Self-pay

## 2022-07-14 ENCOUNTER — Inpatient Hospital Stay
Admission: RE | Admit: 2022-07-14 | Discharge: 2022-07-14 | Disposition: A | Discharge status: Home / Self Care | Payer: Medicare Other | Source: Ambulatory Visit | Attending: Family Medicine

## 2022-07-14 DIAGNOSIS — Z1231 Encounter for screening mammogram for malignant neoplasm of breast: Secondary | ICD-10-CM | POA: Insufficient documentation

## 2022-07-20 ENCOUNTER — Other Ambulatory Visit (HOSPITAL_COMMUNITY): Payer: Self-pay | Admitting: Orthopaedic Surgery

## 2022-07-20 DIAGNOSIS — R0989 Other specified symptoms and signs involving the circulatory and respiratory systems: Secondary | ICD-10-CM

## 2022-08-08 ENCOUNTER — Other Ambulatory Visit: Payer: Self-pay

## 2022-08-08 ENCOUNTER — Inpatient Hospital Stay
Admission: RE | Admit: 2022-08-08 | Discharge: 2022-08-08 | Disposition: A | Discharge status: Home / Self Care | Payer: Medicare Other | Source: Ambulatory Visit | Attending: Orthopaedic Surgery | Admitting: Orthopaedic Surgery

## 2022-08-08 DIAGNOSIS — R0989 Other specified symptoms and signs involving the circulatory and respiratory systems: Secondary | ICD-10-CM | POA: Insufficient documentation

## 2022-09-12 ENCOUNTER — Other Ambulatory Visit: Payer: Self-pay

## 2022-09-12 ENCOUNTER — Other Ambulatory Visit (HOSPITAL_COMMUNITY): Payer: Self-pay | Admitting: Orthopaedic Surgery

## 2022-09-12 ENCOUNTER — Other Ambulatory Visit: Payer: Medicare Other | Attending: Orthopaedic Surgery

## 2022-09-12 ENCOUNTER — Inpatient Hospital Stay (HOSPITAL_COMMUNITY)
Admission: RE | Admit: 2022-09-12 | Discharge: 2022-09-12 | Disposition: A | Discharge status: Home / Self Care | Payer: Medicare Other | Source: Ambulatory Visit | Attending: Orthopaedic Surgery

## 2022-09-12 DIAGNOSIS — Z01818 Encounter for other preprocedural examination: Secondary | ICD-10-CM

## 2022-09-12 LAB — BASIC METABOLIC PANEL
ANION GAP: 7 mmol/L (ref 4–13)
BUN/CREA RATIO: 20 (ref 6–22)
BUN: 18 mg/dL (ref 7–25)
CALCIUM: 9.8 mg/dL (ref 8.6–10.3)
CHLORIDE: 104 mmol/L (ref 98–107)
CO2 TOTAL: 27 mmol/L (ref 21–31)
CREATININE: 0.91 mg/dL (ref 0.60–1.30)
ESTIMATED GFR: 65 mL/min/{1.73_m2} (ref >59)
GLUCOSE: 123 mg/dL — ABNORMAL HIGH (ref 74–109)
OSMOLALITY, CALCULATED: 279 mOsm/kg (ref 270–290)
POTASSIUM: 4.1 mmol/L (ref 3.5–5.1)
SODIUM: 138 mmol/L (ref 136–145)

## 2022-09-12 LAB — URINALYSIS, MACROSCOPIC
BILIRUBIN: NEGATIVE mg/dL
BLOOD: NEGATIVE mg/dL
GLUCOSE: NEGATIVE mg/dL
KETONES: NEGATIVE mg/dL
LEUKOCYTES: NEGATIVE WBCs/uL
NITRITE: NEGATIVE
PH: 7 (ref 5.0–9.0)
PROTEIN: NEGATIVE mg/dL
SPECIFIC GRAVITY: 1.012 (ref 1.002–1.030)
UROBILINOGEN: NORMAL mg/dL

## 2022-09-12 LAB — CBC
HCT: 38.9 % (ref 31.2–41.9)
HGB: 13.3 g/dL (ref 10.9–14.3)
MCH: 30.8 pg (ref 24.7–32.8)
MCHC: 34.3 g/dL (ref 32.3–35.6)
MCV: 89.9 fL (ref 75.5–95.3)
MPV: 8.2 fL (ref 7.9–10.8)
PLATELETS: 296 10*3/uL (ref 140–440)
RBC: 4.33 10*6/uL (ref 3.63–4.92)
RDW: 13.4 % (ref 12.3–17.7)
WBC: 6 10*3/uL (ref 3.8–11.8)

## 2022-09-12 LAB — URINALYSIS, MICROSCOPIC
RBCS: 1 /hpf (ref <4)
SQUAMOUS EPITHELIAL: <1 /hpf (ref <28)

## 2022-09-14 DIAGNOSIS — Z01818 Encounter for other preprocedural examination: Secondary | ICD-10-CM

## 2022-09-14 DIAGNOSIS — M75101 Unspecified rotator cuff tear or rupture of right shoulder, not specified as traumatic: Secondary | ICD-10-CM

## 2022-09-14 LAB — ECG 12 LEAD
Atrial Rate: 65 {beats}/min
Calculated P Axis: 57 degrees
Calculated R Axis: 81 degrees
Calculated T Axis: 78 degrees
PR Interval: 148 ms
QRS Duration: 80 ms
QT Interval: 406 ms
QTC Calculation: 422 ms
Ventricular rate: 65 {beats}/min

## 2022-09-30 ENCOUNTER — Encounter (HOSPITAL_COMMUNITY): Admission: RE | Payer: Self-pay | Source: Ambulatory Visit

## 2022-09-30 ENCOUNTER — Inpatient Hospital Stay: Admission: RE | Admit: 2022-09-30 | Payer: Medicare Other | Source: Ambulatory Visit | Admitting: Orthopaedic Surgery

## 2022-09-30 SURGERY — ARTHROSCOPY SHOULDER
Site: Shoulder | Laterality: Right

## 2022-10-10 ENCOUNTER — Other Ambulatory Visit: Payer: Self-pay

## 2022-10-10 ENCOUNTER — Other Ambulatory Visit: Payer: Medicare Other | Attending: Orthopaedic Surgery

## 2022-10-10 DIAGNOSIS — Z01818 Encounter for other preprocedural examination: Secondary | ICD-10-CM | POA: Insufficient documentation

## 2022-10-10 LAB — URINALYSIS, MICROSCOPIC
RBCS: 1 /hpf (ref <4)
WBCS: <1 /hpf (ref <6)

## 2022-10-10 LAB — CBC WITH DIFF
BASOPHIL #: 0 10*3/uL (ref 0.00–0.10)
BASOPHIL %: 0 % (ref 0–1)
EOSINOPHIL #: 0.1 10*3/uL (ref 0.00–0.50)
EOSINOPHIL %: 2 % (ref 1–7)
HCT: 37.6 % (ref 31.2–41.9)
HGB: 13 g/dL (ref 10.9–14.3)
LYMPHOCYTE #: 0.9 10*3/uL — ABNORMAL LOW (ref 1.00–3.00)
LYMPHOCYTE %: 15 % — ABNORMAL LOW (ref 16–44)
MCH: 30.8 pg (ref 24.7–32.8)
MCHC: 34.5 g/dL (ref 32.3–35.6)
MCV: 89.4 fL (ref 75.5–95.3)
MONOCYTE #: 0.6 10*3/uL (ref 0.30–1.00)
MONOCYTE %: 9 % (ref 5–13)
MPV: 8.2 fL (ref 7.9–10.8)
NEUTROPHIL #: 4.6 10*3/uL (ref 1.85–7.80)
NEUTROPHIL %: 74 % (ref 43–77)
PLATELETS: 356 10*3/uL (ref 140–440)
RBC: 4.21 10*6/uL (ref 3.63–4.92)
RDW: 12.9 % (ref 12.3–17.7)
WBC: 6.2 10*3/uL (ref 3.8–11.8)

## 2022-10-10 LAB — URINALYSIS, MACROSCOPIC
BILIRUBIN: NEGATIVE mg/dL
BLOOD: NEGATIVE mg/dL
GLUCOSE: NEGATIVE mg/dL
KETONES: NEGATIVE mg/dL
LEUKOCYTES: 25 WBCs/uL — AB
NITRITE: NEGATIVE
PH: 7 (ref 5.0–9.0)
PROTEIN: NEGATIVE mg/dL
SPECIFIC GRAVITY: 1.014 (ref 1.002–1.030)
UROBILINOGEN: NORMAL mg/dL

## 2022-10-10 LAB — BASIC METABOLIC PANEL
ANION GAP: 5 mmol/L (ref 4–13)
BUN/CREA RATIO: 20 (ref 6–22)
BUN: 17 mg/dL (ref 7–25)
CALCIUM: 9.5 mg/dL (ref 8.6–10.3)
CHLORIDE: 106 mmol/L (ref 98–107)
CO2 TOTAL: 27 mmol/L (ref 21–31)
CREATININE: 0.85 mg/dL (ref 0.60–1.30)
ESTIMATED GFR: 70 mL/min/{1.73_m2} (ref >59)
GLUCOSE: 89 mg/dL (ref 74–109)
OSMOLALITY, CALCULATED: 277 mOsm/kg (ref 270–290)
POTASSIUM: 3.9 mmol/L (ref 3.5–5.1)
SODIUM: 138 mmol/L (ref 136–145)

## 2022-10-12 LAB — URINE CULTURE: URINE CULTURE: NO GROWTH

## 2022-10-27 NOTE — H&P (Signed)
[facility] [address] [place], [place] [address] [phone]  Name: Lorraine Rojas DOB: 1945/06/10 Gender: F MR#: [id number]  Provider: Alexia Freestone, MD,FAAOS  Encounter Date: 07/20/22  Referral:   Care team: Nelle Don, Primary,  COVID VISIT INFO: Modified OV due to COVID restrictions, limited contact exam, VS not recorded, Patient not candidate for TELE visit. PPE and precautions used to minimize risk of transmission.  CHIEF COMPLAINT  Right Shoulder pain; 07/20/22; PJB  HISTORY  OA right shoulder with RCT/impingement: 77 year old female presents with continued right shoulder pain/weakness for several years increasing in the last several months. States pain is anterior activity related with range of motion, internal rotation, lifting objects away from the body, nighttime pain. D Notes increase in symptoms after sev eral falls in the last couple of months.. Conservative care includes activity modification, ice, heat, NSAIDs, steroid injection, and HEP with no relief in symptoms. MRI shows large rotator cuff tear with retraction and moderate fatty infiltratio n of the supraspinatus. Minimal arthritic change.  PAST MEDICAL HISTORY  Denies history of cancer Cardiac: Hypertension and Hyperlipidemia Heart murmur GI history: Barrett Esophagus, GERD w/o esophagitis and Hiatal hernia Treatment: N/A By: Iron deficiency Musculoskeletal: Denies OA, gout and other common musculoskeletal problems. Rheumatologic: Denies N/A Carotid stenosis. Peripheral vascular disease  PAST SURGICAL HISTORY  Date Side/ Site Procedure Details Doctor Facility Complications 08/20/19 Bilateral Eye cataracts none 10/17/18 Left Hip hemiarthroplasty Bipolar, Dr. Leighton Parody MD, [facility]  1  Lower extremity-Hip 08/19/81 partial hysterectomy 08/20/79 tubal ligation  [facility]  none none  ALLERGIES  Name Reactions Notes Biaxin Diarrhea thimerosal Burning Eye burning latex Irritation/redness Eye  burning  MEDICATIONS  Name Cartia XT 120 mg 1 oral qd ------Calcium Channel Blockers - Benzothiazepines Cozaar 100 mg 1 oral qd ------Angiotensin II Receptor Blockers (ARBs) Lipitor 20 mg 1 oral qd ------Antihyperlipidemic - HMG CoA Reductase Inhibitors (statins) ------INTERACTIONS: Drug-drug interaction to Cartia XT; Pepcid AC 10 mg 1 2 oral bid 2019-07-19 ------Gastric Acid Secretion Reducers - Histamine H2-Receptor Antagonists CoQ10 SG 100 100-100 mg-unit 1 oral qd 2019-07-19 ------Alternative Therapy - Unclassified Multi Vitamin 9 mg iron/15 mL 1 oral qd 2019-07-19 ------Multiple Vitamins and Mineral Combinations Fiber Therapy 500 mg 1 oral qd 2019-07-19 ------Laxative - Bulk Forming Prilosec 40 mg 1 oral qd 2019-07-19 ------Gastric Acid Secretion Reducing Agents - Proton Pump Inhibitors (PPIs) Urecholine 5 mg 1 oral qd 2019-07-19 ------Urinary Retention Therapy - Parasympathomimetic Agents Natural Potassium 99 mg 1 oral qd 2019-07-19 ------Minerals & Electrolytes - Potassium, Oral ------INTERACTIONS: Drug-drug interaction to Cozaar; Strength Thompson Grayer 2019-07-19 Date 2019-07-19 2019-07-19  Family and Social History  Patient is Married. 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 No brothers; No sisters. Diabetes in mother. Hypertension in mother and father MI in father and Grandfather Cholesterol Grandmother  2  Stroke in father Neurologic : inmother N/A, mother, No known family history of Bleeding, Blood clots  REVIEW OF SYSTEMS  General: No history of change in weight, fever, chills, change in energy, fatigue or changes in sleep habits. Opthalmic: Negative for change in vision / eye problems. ENT: Denies congestion, sinus, throat and hearing issues. Respiratory: Denies SOB, cough and other respiratory symptoms. Cardiac: Denies chest pain, CHF and other cardiac  symptoms. GI: No changes in bowel habits, abdominal pain or other GI complaint. No history of prior pregnancy. Musculoskeletal: Denies significant pain, joint or muscular  symptoms. Neurological: Denies cephalgia, seizures, weakness, numbness and other neurologic symptoms. Endocrine: Denies polyuria, polydypsia and other endocrine related complaints. Behavioral: Denies significant mood and psychological symptoms. Hemeatologic: Denies anemia, bleeding / clotting problems and other blood related complaints. Stop Bang interpretation / modified: Low probability of OSA: Score 1 RCRI, no risk factors, risk of cardiac death, nonfatal MI and cardiac arrest, 0.4%. Risk of MI, Pulmonary edema, ventricular fib, cardiac arrest and complete heart block 0.5% POUR / IPSS score 0. Score classified as mild level of symptoms, with no or mild increase in risk of urinary retention. Charlson score: Enter PMFSH then select calculate score resulting in estimated in hospital mortality rate of 1.2%  EXAM  Right shoulder: Full passive range of motion. Significant weakness in initiation of abduction negative drop arm and external rotation lag signs. Neurologic exam is normal. Circulation is intact.  Increase cervical lordosis protracted posture of the shoulders. AC joint is nontender.  Patient has left carotid bruit. General exam   BMI   Cardiac, respiratory and mental status NORMAL  X-RAYS  Date Name Review 06/20/2022 R Shoulder MR Rotator cuff: Retracted tear. Acromioclavicular joint degenerative change. Biceps long head normal. Labrum: normal. Ligaments: Normal. Synovial: No significant effusion, synovitis or loose bodies noted . Arfthritis: Mild glenohumeral degenerative change. Massive rotator cuff tear.  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 Patient states due to failed conservative care and severity and length of symptoms and declining quality of life she wishes to proceed with surgical interventions. Discussed the risks , benefits, complications, and expected outcomes all in detail including the risk of infection and neurovascular compromise. Will obtain carotid study to evaluate left carotid bruit. Preoperative medical clearance with PCP.  Orders:   Date Name Review 07/20/2022 Carotid ultrasound N/A 06/20/2022 R Shoulder MR Rotator cuff: Retracted tear. Acromioclavicular joint degenerative change. Biceps long head normal. Labrum: normal. Ligaments: Normal. Synovial: No significant effusion, synovitis or loose bodies noted . Arfthritis: Mild glenohumeral degenerative change. Massive rotator cuff tear. 04/12/2022 R Shoulder XR 2V Right Shoulder Primary osteoarthritis Grade Kelligren Lawrence grade 2: Slight narrowing of joint space, minor bone changes consistent with arthritis. 08/20/2019 L HIP PELVIS 2 view Left Hip. Bipolar hip arthroplasty. . 12/06/2018 L HIP PELVIS 2 view Left Hip. Bipolar hip arthroplasty. The patient is status post left total hip arthroplasty in the interval between examinations. The prosthetic components appear to be in anatomic position. 10/16/2018 L HIP PELVIS 2 view Left Hip. Proximal femur positive for Fracturefracture. 1. Left subcapital femoral neck fracture with moderate varus deformity. 2. The rest of the left femur; pelvic bony frame work and right hip joint are unremarkable. 10/16/2018 L Femur XR 2V L FEM Radiograph: 1. Left subcapital femoral neck fracture with moderate varus deformity. 2. The rest of the left femur; pelvic bony frame work and right hip joint are unremarkable.  Date Name Review 08/20/2019  LSSP L HIP PT EVAL TX N/A  RETURN / FOLLOW-UP  Right shoulder rotator cuff repair, subacromial decompression scheduled  Alexia Freestone, MD,FAAOS  Waldron Session, M.D. 07/20/2022 05:16:50  [id number]  V13  4  5    Attestation:  Patient has been examined before  anesthesia, and there are no changes in medical status or indications for surgery.

## 2022-10-28 ENCOUNTER — Ambulatory Visit (HOSPITAL_COMMUNITY): Payer: Medicare Other | Admitting: Anesthesiology

## 2022-10-28 ENCOUNTER — Emergency Department (HOSPITAL_COMMUNITY): Payer: Medicare Other

## 2022-10-28 ENCOUNTER — Ambulatory Visit
Admission: RE | Admit: 2022-10-28 | Discharge: 2022-10-28 | Disposition: A | Discharge status: Home / Self Care | Payer: Medicare Other | Source: Ambulatory Visit | Attending: Orthopaedic Surgery | Admitting: Orthopaedic Surgery

## 2022-10-28 ENCOUNTER — Encounter (HOSPITAL_COMMUNITY): Payer: Medicare Other | Admitting: Orthopaedic Surgery

## 2022-10-28 ENCOUNTER — Encounter (HOSPITAL_COMMUNITY)
Admission: RE | Disposition: A | Discharge status: Home / Self Care | Payer: Self-pay | Source: Ambulatory Visit | Attending: Orthopaedic Surgery

## 2022-10-28 ENCOUNTER — Encounter (HOSPITAL_COMMUNITY): Payer: Self-pay | Admitting: Orthopaedic Surgery

## 2022-10-28 ENCOUNTER — Emergency Department (EMERGENCY_DEPARTMENT_HOSPITAL)
Admission: EM | Admit: 2022-10-28 | Discharge: 2022-10-28 | Discharge status: Against Medical Advice | Payer: Medicare Other | Source: Home / Self Care | Attending: Emergency Medicine | Admitting: Emergency Medicine

## 2022-10-28 ENCOUNTER — Other Ambulatory Visit: Payer: Self-pay

## 2022-10-28 DIAGNOSIS — M539 Dorsopathy, unspecified: Secondary | ICD-10-CM | POA: Insufficient documentation

## 2022-10-28 DIAGNOSIS — M94211 Chondromalacia, right shoulder: Secondary | ICD-10-CM | POA: Insufficient documentation

## 2022-10-28 DIAGNOSIS — S43491A Other sprain of right shoulder joint, initial encounter: Secondary | ICD-10-CM | POA: Insufficient documentation

## 2022-10-28 DIAGNOSIS — I739 Peripheral vascular disease, unspecified: Secondary | ICD-10-CM | POA: Insufficient documentation

## 2022-10-28 DIAGNOSIS — G9341 Metabolic encephalopathy: Secondary | ICD-10-CM | POA: Insufficient documentation

## 2022-10-28 DIAGNOSIS — Z5329 Procedure and treatment not carried out because of patient's decision for other reasons: Secondary | ICD-10-CM | POA: Insufficient documentation

## 2022-10-28 DIAGNOSIS — R9431 Abnormal electrocardiogram [ECG] [EKG]: Secondary | ICD-10-CM | POA: Insufficient documentation

## 2022-10-28 DIAGNOSIS — I10 Essential (primary) hypertension: Secondary | ICD-10-CM | POA: Insufficient documentation

## 2022-10-28 DIAGNOSIS — S46111A Strain of muscle, fascia and tendon of long head of biceps, right arm, initial encounter: Secondary | ICD-10-CM | POA: Insufficient documentation

## 2022-10-28 DIAGNOSIS — T402X1A Poisoning by other opioids, accidental (unintentional), initial encounter: Secondary | ICD-10-CM | POA: Insufficient documentation

## 2022-10-28 DIAGNOSIS — Z87891 Personal history of nicotine dependence: Secondary | ICD-10-CM | POA: Insufficient documentation

## 2022-10-28 DIAGNOSIS — J9601 Acute respiratory failure with hypoxia: Secondary | ICD-10-CM | POA: Insufficient documentation

## 2022-10-28 DIAGNOSIS — E785 Hyperlipidemia, unspecified: Secondary | ICD-10-CM | POA: Insufficient documentation

## 2022-10-28 DIAGNOSIS — M75111 Incomplete rotator cuff tear or rupture of right shoulder, not specified as traumatic: Secondary | ICD-10-CM | POA: Insufficient documentation

## 2022-10-28 DIAGNOSIS — K219 Gastro-esophageal reflux disease without esophagitis: Secondary | ICD-10-CM | POA: Insufficient documentation

## 2022-10-28 DIAGNOSIS — T40601A Poisoning by unspecified narcotics, accidental (unintentional), initial encounter: Secondary | ICD-10-CM

## 2022-10-28 DIAGNOSIS — R519 Headache, unspecified: Secondary | ICD-10-CM | POA: Insufficient documentation

## 2022-10-28 DIAGNOSIS — Z79899 Other long term (current) drug therapy: Secondary | ICD-10-CM | POA: Insufficient documentation

## 2022-10-28 LAB — ECG 12 LEAD
Calculated P Axis: 56 degrees
Calculated R Axis: 20 degrees
Calculated T Axis: 60 degrees
QRS Duration: 88 ms

## 2022-10-28 LAB — POC BLOOD GLUCOSE (RESULTS): GLUCOSE, POC: 174 mg/dl — ABNORMAL HIGH (ref 70–100)

## 2022-10-28 SURGERY — ARTHROSCOPY SHOULDER
Anesthesia: General | Site: Shoulder | Laterality: Right | Wound class: Clean Wound: Uninfected operative wounds in which no inflammation occurred

## 2022-10-28 MED ORDER — SODIUM CHLORIDE 0.9 % (FLUSH) INJECTION SYRINGE
3.0000 mL | INJECTION | Freq: Three times a day (TID) | INTRAMUSCULAR | Status: DC
Start: 2022-10-28 — End: 2022-10-28

## 2022-10-28 MED ORDER — APREPITANT 40 MG CAPSULE
ORAL_CAPSULE | ORAL | Status: AC
Start: 2022-10-28 — End: 2022-10-28
  Filled 2022-10-28: qty 1

## 2022-10-28 MED ORDER — PROPOFOL 10 MG/ML IV BOLUS
INJECTION | Freq: Once | INTRAVENOUS | Status: DC | PRN
Start: 2022-10-28 — End: 2022-10-28
  Administered 2022-10-28: 110 mg via INTRAVENOUS

## 2022-10-28 MED ORDER — DEXTROSE 5 % AND LACTATED RINGERS INTRAVENOUS SOLUTION
INTRAVENOUS | Status: DC
Start: 2022-10-28 — End: 2022-10-28

## 2022-10-28 MED ORDER — MORPHINE 10 MG/ML INJECTION WRAPPER
Freq: Once | INTRAVENOUS | Status: DC | PRN
Start: 2022-10-28 — End: 2022-10-28

## 2022-10-28 MED ORDER — ONDANSETRON HCL (PF) 4 MG/2 ML INJECTION SOLUTION
INTRAMUSCULAR | Status: AC
Start: 2022-10-28 — End: 2022-10-28
  Filled 2022-10-28: qty 4

## 2022-10-28 MED ORDER — SODIUM CHLORIDE 0.9 % (FLUSH) INJECTION SYRINGE
3.0000 mL | INJECTION | INTRAMUSCULAR | Status: DC | PRN
Start: 2022-10-28 — End: 2022-10-28

## 2022-10-28 MED ORDER — ACETAMINOPHEN 325 MG TABLET
975.0000 mg | ORAL_TABLET | Freq: Once | ORAL | Status: AC
Start: 2022-10-28 — End: 2022-10-28
  Administered 2022-10-28: 975 mg via ORAL

## 2022-10-28 MED ORDER — EPHEDRINE SULFATE 50 MG/ML INTRAVENOUS SOLUTION
Freq: Once | INTRAVENOUS | Status: DC | PRN
Start: 2022-10-28 — End: 2022-10-28
  Administered 2022-10-28 (×3): 10 mg via INTRAVENOUS

## 2022-10-28 MED ORDER — LACTATED RINGERS INTRAVENOUS SOLUTION
INTRAVENOUS | Status: DC
Start: 2022-10-28 — End: 2022-10-28

## 2022-10-28 MED ORDER — OXYCODONE-ACETAMINOPHEN 5 MG-325 MG TABLET
1.0000 | ORAL_TABLET | ORAL | Status: DC | PRN
Start: 2022-10-28 — End: 2022-10-28
  Administered 2022-10-28: 1 via ORAL
  Filled 2022-10-28: qty 1

## 2022-10-28 MED ORDER — ALBUTEROL SULFATE 2.5 MG/3 ML (0.083 %) SOLUTION FOR NEBULIZATION
2.5000 mg | INHALATION_SOLUTION | Freq: Once | RESPIRATORY_TRACT | Status: DC | PRN
Start: 2022-10-28 — End: 2022-10-28

## 2022-10-28 MED ORDER — DEXAMETHASONE SODIUM PHOSPHATE (PF) 10 MG/ML INJECTION SOLUTION
10.0000 mg | Freq: Once | INTRAMUSCULAR | Status: AC
Start: 2022-10-28 — End: 2022-10-28
  Administered 2022-10-28: 10 mg via INTRAVENOUS

## 2022-10-28 MED ORDER — FAMOTIDINE (PF) 20 MG/2 ML INTRAVENOUS SOLUTION
20.0000 mg | Freq: Once | INTRAVENOUS | Status: AC
Start: 2022-10-28 — End: 2022-10-28
  Administered 2022-10-28: 20 mg via INTRAVENOUS

## 2022-10-28 MED ORDER — FENTANYL (PF) 50 MCG/ML INJECTION SOLUTION
INTRAMUSCULAR | Status: AC
Start: 2022-10-28 — End: 2022-10-28
  Filled 2022-10-28: qty 2

## 2022-10-28 MED ORDER — MIDAZOLAM 5 MG/ML INJECTION WRAPPER
INTRAMUSCULAR | Status: AC
Start: 2022-10-28 — End: 2022-10-28
  Filled 2022-10-28: qty 1

## 2022-10-28 MED ORDER — ONDANSETRON HCL (PF) 4 MG/2 ML INJECTION SOLUTION
8.0000 mg | Freq: Once | INTRAMUSCULAR | Status: AC
Start: 2022-10-28 — End: 2022-10-28
  Administered 2022-10-28: 8 mg via INTRAVENOUS

## 2022-10-28 MED ORDER — NALOXONE 0.4 MG/ML INJECTION SOLUTION
0.4000 mg | INTRAMUSCULAR | Status: DC | PRN
Start: 2022-10-28 — End: 2022-10-28

## 2022-10-28 MED ORDER — HYDRALAZINE 20 MG/ML INJECTION SOLUTION
INTRAMUSCULAR | Status: AC
Start: 2022-10-28 — End: 2022-10-28
  Filled 2022-10-28: qty 1

## 2022-10-28 MED ORDER — HYDROCODONE 7.5 MG-ACETAMINOPHEN 325 MG TABLET
1.0000 | ORAL_TABLET | Freq: Four times a day (QID) | ORAL | 0 refills | Status: AC | PRN
Start: 2022-10-28 — End: ?

## 2022-10-28 MED ORDER — ROPIVACAINE (PF) 2 MG/ML (0.2 %) INJECTION SOLUTION
INTRAMUSCULAR | Status: AC
Start: 2022-10-28 — End: 2022-10-28
  Filled 2022-10-28: qty 20

## 2022-10-28 MED ORDER — APREPITANT 40 MG CAPSULE
40.0000 mg | ORAL_CAPSULE | Freq: Once | ORAL | Status: AC
Start: 2022-10-28 — End: 2022-10-28
  Administered 2022-10-28: 40 mg via ORAL

## 2022-10-28 MED ORDER — CEFAZOLIN 2 GRAM INTRAVENOUS SOLUTION
INTRAVENOUS | Status: AC
Start: 2022-10-28 — End: 2022-10-28
  Filled 2022-10-28: qty 14.71

## 2022-10-28 MED ORDER — MIDAZOLAM 5 MG/ML INJECTION WRAPPER
0.5000 mg | Freq: Once | INTRAMUSCULAR | Status: DC | PRN
Start: 2022-10-28 — End: 2022-10-28
  Administered 2022-10-28: 0.5 mg via INTRAVENOUS

## 2022-10-28 MED ORDER — ONDANSETRON HCL (PF) 4 MG/2 ML INJECTION SOLUTION
4.0000 mg | Freq: Once | INTRAMUSCULAR | Status: DC | PRN
Start: 2022-10-28 — End: 2022-10-28

## 2022-10-28 MED ORDER — IPRATROPIUM 0.5 MG-ALBUTEROL 3 MG (2.5 MG BASE)/3 ML NEBULIZATION SOLN
3.0000 mL | INHALATION_SOLUTION | Freq: Once | RESPIRATORY_TRACT | Status: DC | PRN
Start: 2022-10-28 — End: 2022-10-28

## 2022-10-28 MED ORDER — LACTATED RINGERS INTRAVENOUS SOLUTION
INTRAVENOUS | Status: DC
Start: 2022-10-28 — End: 2022-10-28
  Administered 2022-10-28: 0 via INTRAVENOUS

## 2022-10-28 MED ORDER — NAPROXEN 250 MG TABLET
ORAL_TABLET | ORAL | Status: AC
Start: 2022-10-28 — End: 2022-10-28
  Filled 2022-10-28: qty 2

## 2022-10-28 MED ORDER — OXYCODONE ER 10 MG TABLET,CRUSH RESISTANT,EXTENDED RELEASE 12 HR
10.0000 mg | EXTENDED_RELEASE_ORAL_TABLET | Freq: Once | ORAL | Status: AC
Start: 2022-10-28 — End: 2022-10-28
  Administered 2022-10-28: 10 mg via ORAL

## 2022-10-28 MED ORDER — ROCURONIUM 10 MG/ML INTRAVENOUS SOLUTION
Freq: Once | INTRAVENOUS | Status: DC | PRN
Start: 2022-10-28 — End: 2022-10-28
  Administered 2022-10-28: 40 mg via INTRAVENOUS

## 2022-10-28 MED ORDER — MORPHINE 10 MG/ML INJECTION WRAPPER
INTRAVENOUS | Status: AC
Start: 2022-10-28 — End: 2022-10-28
  Filled 2022-10-28: qty 2

## 2022-10-28 MED ORDER — CELECOXIB 100 MG CAPSULE
200.0000 mg | ORAL_CAPSULE | Freq: Once | ORAL | Status: DC
Start: 2022-10-28 — End: 2022-10-28

## 2022-10-28 MED ORDER — CEFAZOLIN 2 GRAM INTRAVENOUS SOLUTION
2.0000 g | Freq: Once | INTRAVENOUS | Status: AC
Start: 2022-10-28 — End: 2022-10-28
  Administered 2022-10-28: 2 g via INTRAVENOUS

## 2022-10-28 MED ORDER — SUGAMMADEX 100 MG/ML INTRAVENOUS SOLUTION
Freq: Once | INTRAVENOUS | Status: DC | PRN
Start: 2022-10-28 — End: 2022-10-28
  Administered 2022-10-28: 150 mg via INTRAVENOUS

## 2022-10-28 MED ORDER — FENTANYL (PF) 50 MCG/ML INJECTION WRAPPER
25.0000 ug | INJECTION | INTRAMUSCULAR | Status: DC | PRN
Start: 2022-10-28 — End: 2022-10-28

## 2022-10-28 MED ORDER — FENTANYL (PF) 50 MCG/ML INJECTION WRAPPER
50.0000 ug | INJECTION | INTRAMUSCULAR | Status: DC | PRN
Start: 2022-10-28 — End: 2022-10-28

## 2022-10-28 MED ORDER — HYDRALAZINE 20 MG/ML INJECTION SOLUTION
5.0000 mg | INTRAMUSCULAR | Status: AC
Start: 2022-10-28 — End: 2022-10-28
  Administered 2022-10-28: 5 mg via INTRAVENOUS

## 2022-10-28 MED ORDER — ROPIVACAINE (PF) 2 MG/ML (0.2 %) INJECTION SOLUTION
Freq: Once | INTRAMUSCULAR | Status: DC | PRN
Start: 2022-10-28 — End: 2022-10-28
  Administered 2022-10-28: 10 mL via INTRAMUSCULAR

## 2022-10-28 MED ORDER — LIDOCAINE (PF) 100 MG/5 ML (2 %) INTRAVENOUS SYRINGE
INJECTION | Freq: Once | INTRAVENOUS | Status: DC | PRN
Start: 2022-10-28 — End: 2022-10-28
  Administered 2022-10-28: 60 mg via INTRAVENOUS

## 2022-10-28 MED ORDER — NAPROXEN 250 MG TABLET
500.0000 mg | ORAL_TABLET | Freq: Once | ORAL | Status: AC
Start: 2022-10-28 — End: 2022-10-28
  Administered 2022-10-28: 500 mg via ORAL

## 2022-10-28 MED ORDER — DEXAMETHASONE SODIUM PHOSPHATE (PF) 10 MG/ML INJECTION SOLUTION
INTRAMUSCULAR | Status: AC
Start: 2022-10-28 — End: 2022-10-28
  Filled 2022-10-28: qty 1

## 2022-10-28 MED ORDER — OXYCODONE ER 10 MG TABLET,CRUSH RESISTANT,EXTENDED RELEASE 12 HR
EXTENDED_RELEASE_ORAL_TABLET | ORAL | Status: AC
Start: 2022-10-28 — End: 2022-10-28
  Filled 2022-10-28: qty 1

## 2022-10-28 MED ORDER — ROPIVACAINE (PF) 2 MG/ML (0.2 %) INJECTION SOLUTION
INTRAMUSCULAR | Status: AC
Start: 2022-10-28 — End: 2022-10-28
  Filled 2022-10-28: qty 100

## 2022-10-28 MED ORDER — PROCHLORPERAZINE EDISYLATE 10 MG/2 ML (5 MG/ML) INJECTION SOLUTION
5.0000 mg | Freq: Once | INTRAMUSCULAR | Status: DC | PRN
Start: 2022-10-28 — End: 2022-10-28
  Administered 2022-10-28: 5 mg via INTRAVENOUS
  Filled 2022-10-28: qty 2

## 2022-10-28 MED ORDER — FAMOTIDINE (PF) 20 MG/2 ML INTRAVENOUS SOLUTION
INTRAVENOUS | Status: AC
Start: 2022-10-28 — End: 2022-10-28
  Filled 2022-10-28: qty 2

## 2022-10-28 MED ORDER — FENTANYL (PF) 50 MCG/ML INJECTION WRAPPER
INJECTION | Freq: Once | INTRAMUSCULAR | Status: DC | PRN
Start: 2022-10-28 — End: 2022-10-28
  Administered 2022-10-28 (×2): 25 ug via INTRAVENOUS
  Administered 2022-10-28: 50 ug via INTRAVENOUS

## 2022-10-28 MED ORDER — ACETAMINOPHEN 325 MG TABLET
ORAL_TABLET | ORAL | Status: AC
Start: 2022-10-28 — End: 2022-10-28
  Filled 2022-10-28: qty 3

## 2022-10-28 SURGICAL SUPPLY — 51 items
ANCHOR SUT 2 SWIVELOCK C CLS EYLT TWIST IN KNOTLESS 19.1MM BLU ×2 IMPLANT
ANCHOR SUT 2.6MM 1.7MM FIBERTAK FIBERTAPE BLUE SUTURETAPE WHITE/BLACK ×1 IMPLANT
ANCHOR SUT 2.6MM 1.7MM FIBERTAK FIBERTAPE SUTURETAPE SLF PNCH 1.3MM BLK BLU ×1 IMPLANT
BANDAGE COFLX 5YDX4IN NONST CHSV SLF ADH FOAM COMPRESS TAN LF (WOUND CARE SUPPLY) ×1 IMPLANT
BLADE 11 2 END CBNSTL SURG STRL DISP (SURGICAL CUTTING SUPPLIES) ×1 IMPLANT
BLADE SHAVER 13CM 4MM EXCLBR C_OOLCUT STRL DISP (ENDOSCOPIC SUPPLIES) ×1 IMPLANT
BURR SHAVER 13CM 5MM COOLCUT FLUSHCUT 12 FLUTE OVAL (ENDOSCOPIC SUPPLIES) ×1 IMPLANT
CONV USE 339323 - COVER TBL 90X50IN STD SMS REINF FNFLD STRL LF  DISP (DRAPE/PACKS/SHEETS/OR TOWEL) ×2
CONV USE ITEM 306873 - COVER 53X24IN MAYOSTAND PRXM STRL DISP EQP SMS LF (DRAPE/PACKS/SHEETS/OR TOWEL) ×1
CONV USE ITEM 309809 - PACK SURG ECLIPSE SHLDR PCH STRL DISP 114X77IN 100X66IN LF (DRAPE/PACKS/SHEETS/OR TOWEL) ×1
COUNTER 20 CNT BLOCK ADH NEEDLE STRL LF  RD SHARP FOAM 15.75X11.5X14IN DISP (MED SURG SUPPLIES) ×1 IMPLANT
COVER 53X24IN MAYOSTAND PRXM STRL DISP EQP SMS LF (DRAPE/PACKS/SHEETS/OR TOWEL) ×1 IMPLANT
COVER TBL 90X50IN STD SMS REINF FNFLD STRL LF  DISP (DRAPE/PACKS/SHEETS/OR TOWEL) ×2 IMPLANT
DETERGENT INSTR 22OZ TRNSPT GEL RINSE FREE NEUT PH PREKLENZ CLR PLSNT LF (MISCELLANEOUS PT CARE ITEMS) ×1 IMPLANT
DISCONTINUED NO SUB - DRESS TRNSPR 4.5X4IN FILM IV STRL LF (WOUND CARE SUPPLY) ×1 IMPLANT
DRAPE 2 INCS FILM ANTIMIC 23X17IN IOBN STRL SURG (DRAPE/PACKS/SHEETS/OR TOWEL) ×2 IMPLANT
DRAPE FNFLD ABS REINF 77X53IN 43528 PRXM LF  STRL DISP SURG SMS 44X23IN (DRAPE/PACKS/SHEETS/OR TOWEL) ×5 IMPLANT
DRAPE INCS ANTIMIC 23X23IN IOBN2 TRNSPR (DRAPE/PACKS/SHEETS/OR TOWEL) ×1 IMPLANT
DRESS TRNSPR 4.5X4IN FILM IV STRL LF (WOUND CARE/ENTEROSTOMAL SUPPLY) ×1
DRUG DEL ONQ 2.5IN 26.5IN PUMP KIT ANTIMIC CATH DEHP 2ML/HR SYSTEM 100ML STRL DISP (MED SURG SUPPLIES) ×1 IMPLANT
FIX N TACK SWIVELOCK FIBERLINK SUTURETAPE BIOCOMP SYSTEM TENODESIS ×1 IMPLANT
GLOVE SURG 6.5 LF  PF BEAD CUF STRL CRM 11.3IN PROTEXIS PI PLISPRN THK9.1 MIL (GLOVES AND ACCESSORIES) ×2 IMPLANT
GLOVE SURG 7 LF  PF SMOOTH BEAD CUF INTLK STRL BLU 11.8IN PROTEXIS NEU-THERA PLISPRN THK7.9 MIL (GLOVES AND ACCESSORIES) ×2 IMPLANT
GLOVE SURG 8 LTX PF SMOOTH BEAD CUF STRL YW 12IN PROTEXIS NEU-THERA DDRGL THK8.7 MIL (GLOVES AND ACCESSORIES) ×1 IMPLANT
GLOVE SURG 8.5 LF  PF SMOOTH BEAD CUF INTLK STRL BLU 11.8IN PROTEXIS NEU-THERA PLISPRN THK7.9 MIL (GLOVES AND ACCESSORIES) ×1 IMPLANT
GOWN SURG LRG STD LGTH L3 HKLP CLSR RGLN SLEEVE TWL STRL LF  DISP GRN AERO BLU PRFRM FBRC (DRAPE/PACKS/SHEETS/OR TOWEL) ×1 IMPLANT
GOWN SURG XL STD LGTH L3 HKLP CLSR RGLN SLEEVE TWL STRL LF  DISP GRN AERO BLU PRFRM FBRC (DRAPE/PACKS/SHEETS/OR TOWEL) ×2 IMPLANT
GOWN SURG XL XLNG L4 REINF HKLP CLSR SET IN SLEEVE STRL LF  DISP BLU SIRUS SMS PE 56IN (DRAPE/PACKS/SHEETS/OR TOWEL) ×1 IMPLANT
LABEL MED CORRECT MED LABELING SYS 4 FLG 2 SHEET 24 PRPRNT STRL (MED SURG SUPPLIES) ×1 IMPLANT
MAT INSTR TRY 44X36IN WTPRF BACKSHEET TPNX BLU (MISCELLANEOUS PT CARE ITEMS) ×3 IMPLANT
MATTRESS TRANSF 78X34IN AIRPAL C1000LB LONG STD 2 BELT 2 HOSE ATTACHMENT PNT SNAP BUTTON LBL DISP (MED SURG SUPPLIES) ×1 IMPLANT
NEEDLE HYPO  18GA 1.5IN REG WL BD PRCSNGL POLYPROP REG BVL LL HUB CLR CD DEHP-FR STRL LF  DISP (MED SURG SUPPLIES) ×1 IMPLANT
NEEDLE SPINAL PNK 3.5IN 18GA QUINCKE REG WL POLYPROP QUINCKE TIP STRL LF  DISP (MED SURG SUPPLIES) ×1 IMPLANT
NEEDLE SUT MEGALOADER HD SCORPION (SUTURE AIDS) ×1 IMPLANT
PACK SURG ECLIPSE SHLDR PCH STRL DISP 114X77IN 100X66IN LF (DRAPE/PACKS/SHEETS/OR TOWEL) ×1 IMPLANT
PAD ABDOMINAL 8X7.5IN LF  STRL (WOUND CARE SUPPLY) ×2 IMPLANT
PROBE ESURG APOLLORF I90 90 DEG ASPIRATE ABLATOR (MED SURG SUPPLIES) ×1 IMPLANT
PUMP TUBING 13FT CONT WV III DUALWAVE ARTHRO STRL DISP (MED SURG SUPPLIES) ×1 IMPLANT
SET TUBING DUALWAVE CASSETTE OFLW ASCP PUMP STRL DISP (ENDOSCOPIC SUPPLIES) ×1 IMPLANT
SLING ORTHO LRG 17.5X8.5IN ARM SHLDR PAD ENV BRTHBL HKLP (ORTHOPEDICS (NOT IMPLANTS)) ×1 IMPLANT
SOL IRRG LR 3L PRSV N-PYRG FLXB CONTAINR STRL LF (MEDICATIONS/SOLUTIONS) ×8 IMPLANT
SOL SURG PREP 26ML DRPRP 74% ISPRP 0.7% IOD POVACRYLEX SLF CNTN APPL SKIN STRL PREOP (MED SURG SUPPLIES) ×1 IMPLANT
SPONGE GAUZE 4X4IN MDCHC COTTON 12 PLY TY 7 LF  STRL DISP (WOUND CARE SUPPLY) ×2 IMPLANT
SPONGE LAP 18X18IN PREWASH RIGID TRY STRL LF  WHT (MED SURG SUPPLIES) ×1 IMPLANT
SUTURE 4-0 FS1 PROLENE 18IN BLU MONOF NONAB (SUTURE/WOUND CLOSURE) ×2 IMPLANT
SYRINGE LL 10ML LF  STRL GRAD N-PYRG DEHP-FR PVC FREE MED DISP (MED SURG SUPPLIES) ×1 IMPLANT
TAPE TRANSPORE 1IN 12/BX_120/CS 15271 (WOUND CARE SUPPLY) ×1 IMPLANT
TOWEL 24X16IN COTTON BLU DISP SURG STRL LF (DRAPE/PACKS/SHEETS/OR TOWEL) ×4 IMPLANT
TUBING IRRG 6FT DUALWAVE BKFL CK VALVE EXT STRL DISP (ENDOSCOPIC SUPPLIES) ×1 IMPLANT
TUBING SUCT CLR 12FT .25IN ARGYLE PVC NCDTV STR MALE FEMALE MLD CONN STRL LF (MED SURG SUPPLIES) ×3 IMPLANT
TUBING SUCT CLR 6FT .25IN ARGYLE PVC NCDTV STR MALE FEMALE MLD CONN STRL LF (MED SURG SUPPLIES) ×1 IMPLANT

## 2022-10-28 NOTE — Discharge Instructions (Addendum)
Follow up with Dr Lindwood Qua on November 09, 2022 at 1:35 pm (You will see Herbert Seta)    Keep shoulder in sling even while sleeping  until follow up but may remove sling to take showers.    If On Q pain pump is present, it will go flat in 2-3 days.  When the ball is completely flat, remove operative dressing and clear op site dressing and remove pain pump.  You will see a black tip at the end of the tubing to know that you have removed all of the tubing.  After the pain pump is out you can start daily showers.    Cover incisions with bandaids until follow up     Exercise fingers during the healing process.      May use ice to operative shoulder 20 minutes on then 20 minutes off every hour. DO NOT PLACE ICE DIRECTLY ON SKIN    No lifting, pulling, or tugging.    Call office for problems, questions, concerns.    Resume home meds.    New prescriptions: NORCO 1 TAB BY MOUTH EVERY 6 HOURS AS NEEDED FOR PAIN

## 2022-10-28 NOTE — Nurses Notes (Signed)
Pt has met criteria for discharge. Pt ambulated to bathroom, was able to void. Pt ate 3 packs of crackers. Pt drank 2 colas at each. Pt husband at bedside, educated per discharge instructions, voiced he would be able to get her into the home. Pt stood to be dressed and ambulated to wheelchair. Pt was orientated x4. Advised pt husband if he or the son have any questions to call us and number was given. Pt husband states he would have more help to care for her this afternoon.

## 2022-10-28 NOTE — Anesthesia Postprocedure Evaluation (Signed)
Anesthesia Post Op Evaluation    Patient: Lorraine Rojas  Procedure(s):  ARTHROSCOPIC SUBACROMIAL DECOMPRESSION WITH ARTHROSCOPIC ROTATOR CUFF REPAIR, BICEPS TENODESIS AND PLACEMENT OF ON-Q PAIN PUMP, RIGHT SHOULDER    Last Vitals:Temperature: 36.4 C (97.5 F) (10/28/22 1032)  Heart Rate: 72 (10/28/22 1115)  BP (Non-Invasive): (!) 132/59 (10/28/22 1100)  Respiratory Rate: (!) 11 (10/28/22 1115)  SpO2: 100 % (10/28/22 1115)    No notable events documented.    Patient is sufficiently recovered from the effects of anesthesia to participate in the evaluation and has returned to their pre-procedure level.  Patient location during evaluation: PACU       Patient participation: complete - patient participated  Level of consciousness: awake and alert and responsive to verbal stimuli    Pain score: 0  Pain management: adequate  Airway patency: patent    Anesthetic complications: no  Cardiovascular status: acceptable  Respiratory status: acceptable  Hydration status: acceptable  Patient post-procedure temperature: Pt Normothermic   PONV Status: Absent

## 2022-10-28 NOTE — ED Nurses Note (Signed)
Pt spouse to desk numerous times to ask to sign out pt. Pt is lethargic, but able to be awakened and alert and oriented x 4. Pt and spouse both requesting to leave multiple times. Pt educated on risks of respiratory depression from sedation given after surgery earlier in the day. Pt able to verbalize risks and stand without assistance. Pt signed AMA paperwork at this time.

## 2022-10-28 NOTE — Anesthesia Procedure Notes (Signed)
Lorraine Rojas    Airway Note  General Information and Staff   Authorizing provider: Cornett, Mayme Genta, MD  Performing provider: Leigh Aurora, CRNA        Urgency: elective    Airway not difficult    Indications and Patient Condition  Pt location: In Or  Indications for airway management: anesthesia  Spontaneous Ventilation: absent  Sedation level: deep  Preoxygenated: yes  Patient position: sniffing  Mask difficulty assessment: 1 - vent by mask        Final Airway Details  Final airway type: endotracheal airway        Successful airway: ETT and cuffed     Successful intubation technique: direct laryngoscopy  Facilitating devices/methods: intubating stylet              Endotracheal tube insertion site: oral  Blade: Macintosh  Blade size: #3  Airway size (mm): 7.0  Cormack-Lehane Classification: grade I - full view of glottis  Placement verified by: chest auscultation and capnometry   Marked at 20  Measured from: lips  Secured with: Tape  Number of attempts at approach: 1  Ventilation between attempts: none  Number of other approaches attempted: 0Airway complications: Atraumatic

## 2022-10-28 NOTE — Anesthesia Preprocedure Evaluation (Signed)
ANESTHESIA PRE-OP EVALUATION  Planned Procedure: ARTHROSCOPIC SUBACROMIAL DECOMPRESSION WITH ROTATOR CUFF REPAIR RIGHT SHOULDER (Right: Shoulder)  Review of Systems     anesthesia history negative     patient summary reviewed  nursing notes reviewed        Pulmonary  negative pulmonary ROS,    Cardiovascular    Hypertension, ECG reviewed, PVD, ACE / ARB inhibitor use and hyperlipidemia ,No peripheral edema,        GI/Hepatic/Renal    GERD and well controlled        Endo/Other   neg endo/other ROS,       Neuro/Psych/MS    headaches, back abnormality     Cancer    negative hematology/oncology ROS,                     Physical Assessment      Airway       Mallampati: II    TM distance: 3 FB    Neck ROM: full  Mouth Opening: good.            Dental           (+) caps, missing           Pulmonary    Breath sounds clear to auscultation  (-) no rhonchi, no decreased breath sounds, no wheezes, no rales and no stridor     Cardiovascular    Rhythm: regular  Rate: Normal  (-) no friction rub, carotid bruit is not present, no peripheral edema and no murmur     Other findings              Plan  ASA 3     Planned anesthesia type: general     general anesthesia with endotracheal tube intubation      PONV Plan:  I plan to administer pharmcologic prophalaxis antiemetics  POV PLAN:   plan for postoperative opioid use            Intravenous induction     Anesthesia issues/risks discussed are: Dental Injuries, Eye /Visual Loss, Nerve Injuries, PONV, Stroke, Aspiration, Difficult Airway, Cardiac Events/MI, Intraoperative Awareness/ Recall, Blood Loss and Sore Throat.  Anesthetic plan and risks discussed with patient  signed consent obtained          Patient's NPO status is appropriate for Anesthesia.           Plan discussed with CRNA.    (Use glidescope. Medical clearance by Phil Dopp DO)

## 2022-10-28 NOTE — OR Surgeon (Addendum)
[place] MEDICINE [facility]  Operative Note     PATIENT NAME:  Lorraine Rojas, Lorraine Rojas  MRN:  [id number]  DOB:  [birth year]    Date of Procedure:  10/28/2022  Preoperative Diagnosis: ROTATOR CUFF TEAR RIGHT SHOULDER   Postoperative Diagnoses:  ROTATOR CUFF TEAR RIGHT SHOULDER   Procedure Performed: Procedure(s) (LRB):  ARTHROSCOPIC SUBACROMIAL DECOMPRESSION WITH ARTHROSCOPIC ROTATOR CUFF REPAIR, BICEPS TENODESIS AND PLACEMENT OF ON-Q PAIN PUMP, RIGHT SHOULDER (Right)   Findings:  Site FINDING Notes   Rotator cuff Tear, SS, superior subscap, 1.5cm retracted, fair tissue 4 anchor cuff repair   Subacromial space Lateral arch stenosis Extensive coplane of distal clavicle, SAD   Biceps >50% tear TEnodesis   Labrum Degen tear Debride   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 Item Serial No. Manufacturer Lot No. LRB No. Used Action   FIX N TACK SWIVELOCK FIBERLINK SUTURETAPE BIOCOMP SYSTEM TENODESIS - [id number]  FIX N TACK SWIVELOCK FIBERLINK SUTURETAPE BIOCOMP SYSTEM TENODESIS  ARTHREX [id number] Right 1 Implanted   ANCHOR SUT 2 SWIVELOCK C CLS EYLT TWIST IN KNOTLESS 19.1MM BLU - [id number]  ANCHOR SUT 2 SWIVELOCK C CLS EYLT TWIST IN KNOTLESS 19.1MM BLU  ARTHREX [id number] Right 2 Implanted   ANCHOR SUT 2.6MM 1.7MM FIBERTAK FIBERTAPE BLUE SUTURETAPE WHITE/BLACK - [id number]  ANCHOR SUT 2.6MM 1.7MM FIBERTAK FIBERTAPE BLUE SUTURETAPE WHITE/BLACK  ARTHREX [id number] Right 1 Implanted   ANCHOR SUT 2.6MM 1.7MM FIBERTAK FIBERTAPE SUTURETAPE SLF PNCH 1.3MM BLK BLU - [id number]  ANCHOR SUT 2.6MM 1.7MM FIBERTAK FIBERTAPE SUTURETAPE SLF PNCH 1.3MM BLK BLU  ARTHREX [id number] Right 1 Implanted      Surgeon: Alexia Freestone, MD   Anesthesia: Anesthesiologist: Cornett, Mayme Genta, MD  CRNA: Leigh Aurora, CRNA general  OR Staff: Circulator: Alease Medina, RN  PERIOPERATIVE CARE ASSISTANT: Catha Brow, PCA;  Selinda Eon, PCA  Scrub Person: Carlyle Dolly', ST  Scrub First Assist: Doreene Adas, CST   Anticoagulation: ASA     Antibiotics:  Kefzol  Estimated Blood Loss: * No blood loss amount entered *   Specimens: * No specimens in log *   Complications: None immediate  Indications For Procedure:  Lorraine Rojas  is a pleasant 77 y.o. female  presenting  for ROTATOR CUFF TEAR RIGHT SHOULDER Risks, benefits, indications, risks, complications, and expectations for shoulder arthroscopy was discussed and contrasted with open and non-surgical care. Informed signed consent obtained.  Description of procedure:  Diagnostic arthroscopy:  Site and site was identified.  Time-out was completed with the entire operating room staff.  After adequate anesthesia, the patient was positioned in lateral decubitus position involved side up with 7 pounds of traction.  Diagnostic arthroscopy was performed with the above listed findings.  Debridement and synovectomy:    Working through anterior, posterior and lateral portals, all unstable articular cartilage was debrided to a stable border using shavers and hand instruments.  All loose fragments of cartilage removed.  Hypertrophic synovium was debrided using shavers and electrocautery as needed.    Bicep tenodesis:     Lateral and high anterior portals established.  The long head of the biceps was isolated.  A Suture scorpion was used to pass an Arthrex suture loop around the long head of the biceps.  The tail of the suture was passed through the tendon  distal to the loop.  The construct was cinched down securing the tendon.  The suture was passed out the anterior portal and then secured to the bone at the top of the bicipital groove with a swivel-lock anchor.  The tail was cut short and then the biceps resected proximal to the tenodesis suture.  The superior labrum and stump was debrided.    Subacromial Decompression:  After debridement of the bursal tissue in the subacromial  space, distal acromion was carefully visualized.  The bur was positioned posteriorly and then the anterior portion of the acromion was removed with the bur until the anterolateral hook was removed, and the subacromial surface was flat.  Outer 8mm of clavicle co-planed due to impingment at the arch. Stasis was as needed with cautery.    Arthroscopic cuff repair four anchors  The cuff tear was visualized from the glenohumeral joint and significant unstable cuff tissue debrided.  The subacromial space was entered and bursa debrided.  The footprint of the cuff into the greater tuberosity was debrided lightly abraded preparing bed for cuff insertion.  The cuff was mobilized using graspers, determining the best position for suture placement.  The awl was used to make 2 holes in the footprint of the cuff.  2 anchors were loaded into these holes and secured.  The suture passing device was used to pass anterior anchor tapes through the anterior position and the cuff determined by grasping.  Procedure was repeated using the posterior tapes.  Tapes were then separated into 2 anterior and 2 posterior tapes.  One anterior and one posterior tape was grasped and brought out through the lateral portal.  Lateral anchor holes were created in the bone.  The 2 tapes were brought through the second-row anchor which was then driven into the bone.  The anchor was driven home.  Tapes were cut flush.  Same process repeat was repeated for the posterior anchor.    On-Q catheter was inserted into the subacromial space. Shoulder was injected with Ropivicaine.  Stab incisions closed, dressings applied.  Patient placed in a sling and taken to recovery under the supervision of anesthesia.

## 2022-10-28 NOTE — ED Notes (Signed)
Monitor tech stated SpO2 reading in the 70's Upon entering the room Dr in room with pt. Dr believes morphine not being processed in body correctly and gave instructions to remove evolve ball with morphine. Evolve ball removed at this time. Pt placed on 2L NC of oxygen at this time.

## 2022-10-28 NOTE — ED Nurses Note (Signed)
In to fill out AMA form with patient. Again ensured pt alert and oriented x4. Pt signed paperwork and removed from monitor at this time. Pt spouse out to get the car at this time. Pt verbalized signs and symptoms of respiratory depression and states if she begins to feel worse she will come back in. Pt able to stand without assistance and ambulate into wheelchair at this time. Pt assisted to waiting area and into car.

## 2022-10-28 NOTE — ED Triage Notes (Signed)
Pt had surgery this AM and discharged this afternoon. Husband states pt seems more altered and confused.

## 2022-10-28 NOTE — ED Nurses Note (Signed)
Pt spouse to nurses station requesting to sign out. Explained we wanted to observe pt for difficulty breathing from sedation and pt and spouse agreeable to stay at this time.

## 2022-10-28 NOTE — ED Provider Notes (Signed)
[facility]  Emergency Department  Attending Provider Note      CHIEF COMPLAINT  Chief Complaint   Patient presents with    Confusion     HISTORY OF PRESENT ILLNESS  Lorraine Rojas, date of birth 1945/03/30, is a 77 y.o. female who presented to the Emergency Department.    PATIENT PRESENTS EMERGENCY DEPARTMENT TODAY AS A RESULT OF INCREASED ALTERED MENTAL STATUS OCCURRING PRIOR TO ARRIVAL.  SHE HAD RIGHT SHOULDER SURGERY EARLIER TODAY.  SHE HAS A MORPHINE BALL DELIVERING MORPHINE TO THE AREA OF SURGERY.  NOTHING ADDITIONAL REPORTED BETTER EXACERBATE OVERALL MILD-TO-MODERATE CONDITION.  NO FEVERS CHILLS CHEST PAIN PALPITATIONS SHORTNESS OF BREATH COUGH DYSURIA HEMATURIA NAUSEA VOMITING.  COMPREHENSIVE 10+ REVIEW OF SYSTEMS OTHERWISE NEGATIVE.  THERE ARE NO OTHER ACUTE COMPLAINTS REPORTED TO ME BY THE PATIENT.    PAST MEDICAL/SURGICAL/FAMILY/SOCIAL HISTORY  Past Medical History:   Diagnosis Date    Esophageal reflux     Essential hypertension     GERD (gastroesophageal reflux disease)     HLD (hyperlipidemia)     Mitral valve regurgitation     Peripheral vascular disease, unspecified (CMS HCC)        Past Surgical History:   Procedure Laterality Date    HX CATARACT REMOVAL Bilateral     HX HIP REPLACEMENT      HX HYSTERECTOMY      HX SEPTOPLASTY      VENOUS THROMBECTOMY         Family Medical History:       Problem Relation (Age of Onset)    Diabetes Mother    Heart Attack Father    No Known Problems Sister, Brother, Maternal Aunt, Maternal Uncle, Paternal Aunt, Paternal Uncle, Maternal Grandmother, Maternal Grandfather, Paternal Grandmother, Paternal Grandfather, Daughter, Son, Other          Social History     Socioeconomic History    Marital status: Married   Tobacco Use    Smoking status: Former     Current packs/day: 0.00     Types: Cigarettes     Quit date: 1975     Years since quitting: 49.6    Smokeless tobacco: Never   Vaping Use    Vaping status: Never Used   Substance and Sexual  Activity    Alcohol use: Yes     Comment: holidays/special occasions    Drug use: Never      ALLERGIES  Allergies   Allergen Reactions    Thimerosal  Other Adverse Reaction (Add comment)    Clarithromycin Diarrhea       PHYSICAL EXAM  VITAL SIGNS:  Filed Vitals:    10/28/22 1800 10/28/22 1815 10/28/22 1830 10/28/22 1845   BP: (!) 114/54 (!) 116/55 (!) 155/62 (!) 150/61   Pulse: 75 73 82 72   Resp: 19 15 16 12    Temp:       SpO2: 91% 91% (!) 86% 100%     GENERAL: PATIENT IS ALERT AND ORIENTED TO PERSON, PLACE, AND TIME.  SHE DOES APPEAR SOMEWHAT LETHARGIC AND DROWSY.  HEAD: NORMOCEPHALIC AND ATRAUMATIC.  EYES: PUPILS EQUALLY ROUND AND REACT TO LIGHT. EXTRAOCULAR MOVEMENTS INTACT.  EARS: GROSS HEARING INTACT. EXTERNAL EARS WITHIN NORMAL LIMITS.  NOSE: NO SEPTAL DEVIATION. NASAL PASSAGES CLEAR.  THROAT: MOIST ORAL MUCOSA.   NECK: SUPPLE. TRACHEA MIDLINE.  HEART: REGULAR, RATE, AND RHYTHM.  LUNGS: CLEAR TO AUSCULTATION BILATERAL BUT DECREASED.  UPON ENTERING THE ROOM THE PATIENT'S OXYGEN SATURATION WAS DOWN IN THE  60s.  ABDOMEN: SOFT, NON-TENDER, NON-DISTENDED, AND BOWEL SOUNDS ARE PRESENT.  GENITOURINARY: DEFERRED.  RECTAL: DEFERRED.  EXTREMITIES: NO CYANOSIS, CLUBBING, OR EDEMA.  SKIN: WARM AND DRY.  MUSCULOSKELETAL: DEFERRED.  NEUROLOGIC: CRANIAL NERVES II THROUGH XII ARE GROSSLY INTACT. SENSATION TO LIGHT TOUCH IS INTACT.  PSYCHIATRIC: JUDGMENT AND INSIGHT ARE SEEMINGLY INTACT. MOOD AND AFFECT ARE APPROPRIATE FOR THE SITUATION.    DIAGNOSTICS  Labs:  Labs listed below were reviewed and interpreted by me.  Results for orders placed or performed during the hospital encounter of 10/28/22   ECG 12 LEAD   Result Value Ref Range    Ventricular rate 73 BPM    Atrial Rate 73 BPM    PR Interval 178 ms    QRS Duration 88 ms    QT Interval 422 ms    QTC Calculation 464 ms    Calculated P Axis 56 degrees    Calculated R Axis 20 degrees    Calculated T Axis 60 degrees   POC BLOOD GLUCOSE (RESULTS)   Result Value Ref Range     GLUCOSE, POC 174 (H) 70 - 100 mg/dl     Radiology:  Results for orders placed or performed during the hospital encounter of 10/28/22   XR CHEST AP     Status: None    Narrative    Lorraine Rojas    RADIOLOGIST: Wynema Birch, MD    XR CHEST AP performed on 10/28/2022 6:37 PM    CLINICAL HISTORY: HYPOXIA.  Ap port hypoxia    TECHNIQUE: Frontal view of the chest.    COMPARISON:  10/16/2018    FINDINGS:    Cardiac and mediastinal contours are stable.   There are chronic-appearing changes of both lungs.           Impression    NO ACUTE FINDINGS.        Radiologist location ID: [id number]         ED COURSE/MEDICAL DECISION MAKING      ED Course as of 10/28/22 1930   Fri Oct 28, 2022   1843 ECG 12 LEAD  TODAY I, Greenleigh Kauth A. Nataliee Shurtz, D.O., PERSONALLY VISUALIZED THE PATIENT'S EKG TRACING.  I AM THE PRIMARY INTERPRETER.  NORMAL SINUS RHYTHM AT A RATE OF 73.  NO AV BLOCKS.  NORMAL AXIS.  BASELINE WANDERING WITH ARTIFACT.  NONSPECIFIC ST-T CHANGES.  NO LVH.  NO BUNDLE-BRANCH BLOCKS.   1916 XR CHEST AP  TODAY I, Latorsha Curling A. Shasta Chinn, D.O., PERSONALLY VISUALIZED THE PATIENT'S DIAGNOSTIC IMAGING STUDIES.  DEFER TO THE RADIOLOGIST'S REPORT FOR THE FINAL DEFINITIVE RADIOLOGY READING.  MY INITIAL INDEPENDENT INTERPRETATION IS AS FOLLOWS, BUT NOT LIMITED TO:  CHRONIC CHANGES        Medical Decision Making  THE PATIENT OPTED TO LEAVE AGAINST MEDICAL ADVICE.    Problems Addressed:  Acute hypoxemic respiratory failure (CMS HCC): acute illness or injury  Acute metabolic encephalopathy: acute illness or injury  Narcotic overdose, accidental or unintentional, initial encounter (CMS [facility]): acute illness or injury    Amount and/or Complexity of Data Reviewed  Independent Historian: spouse  Labs: ordered.  Radiology: ordered and independent interpretation performed. Decision-making details documented in ED Course.  ECG/medicine tests: ordered and independent interpretation performed. Decision-making details documented in ED  Course.    Risk  Prescription drug management.  Diagnosis or treatment significantly limited by social determinants of health.  Risk Details: REMOVED THE MORPHINE DISPENSING BALL AFTER CLEARING IT WITH ORTHO.  IT WAS CLAMPED UPON INITIAL INSPECTION  AND NOTING THE OXYGEN SATURATION WAS IN THE 60's.    Critical Care  Total time providing critical care: 35 minutes    Critical Care Attestation:  As the ED physician, I have provided critical care for this patient.  This patient has high probability of imminent life or limb threatening deterioration due to acute respiratory failure and ACCIDENTAL NARCOTIC OVERDOSE .  I provided direct patient care, documentation of findings, review of medical records, discussion with patient/family, interpretation of EKG, interpretation of chest x-ray, and coordination of multidisciplinary care and frequent reassessment.  Time spent providing critical care (exclusive of any billed procedures and/or teaching): 35 minutes.    CLINICAL IMPRESSION  Clinical Impression   Narcotic overdose, accidental or unintentional, initial encounter (CMS HCC) (Primary)   Acute metabolic encephalopathy   Acute hypoxemic respiratory failure (CMS HCC)     DISPOSITION  AMA       DISCHARGE MEDICATIONS  Current Discharge Medication List          /Tocarra Gassen A. Pioche, DO, MBA   10/28/2022, 16:42   [facility]  Department of Emergency Medicine  [facility]    This note was partially generated using MModal Fluency Direct system, and there may be some incorrect words, spellings, and punctuation that were not noted in checking the note before saving.

## 2022-10-28 NOTE — Anesthesia Transfer of Care (Signed)
ANESTHESIA TRANSFER OF CARE   Lorraine Rojas is a 77 y.o. ,female, Weight: 46.3 kg (102 lb)   had Procedure(s):  ARTHROSCOPIC SUBACROMIAL DECOMPRESSION WITH ARTHROSCOPIC ROTATOR CUFF REPAIR, BICEPS TENODESIS AND PLACEMENT OF ON-Q PAIN PUMP, RIGHT SHOULDER  performed  10/28/22   Primary Service: Alexia Freestone, MD    Past Medical History:   Diagnosis Date   . Esophageal reflux    . Essential hypertension    . GERD (gastroesophageal reflux disease)    . HLD (hyperlipidemia)    . Mitral valve regurgitation    . Peripheral vascular disease, unspecified (CMS HCC)       Allergy History as of 10/28/22       CLARITHROMYCIN         Noted Status Severity Type Reaction    10/27/22 1832 Delight Hoh, RN 07/08/21 Active Low  Diarrhea    07/08/21 0850 Elvia Collum, LPN 16/10/96 Active                 THIMEROSAL         Noted Status Severity Type Reaction    10/27/22 1832 Delight Hoh, RN 10/27/22 Active    Other Adverse Reaction (Add comment)                  I completed my transfer of care / handoff to the receiving personnel during which we discussed:  Access, Airway, All key/critical aspects of case discussed, Analgesia, Antibiotics, Expectation of post procedure, Fluids/Product, Gave opportunity for questions and acknowledgement of understanding, Labs and PMHx      Post Location: PACU                                                           Last OR Temp: Temperature: 36.4 C (97.5 F)  ABG:  POTASSIUM   Date Value Ref Range Status   10/10/2022 3.9 3.5 - 5.1 mmol/L Final     KETONES   Date Value Ref Range Status   10/10/2022 Negative Negative, Trace mg/dL Final     CALCIUM   Date Value Ref Range Status   10/10/2022 9.5 8.6 - 10.3 mg/dL Final     Calculated P Axis   Date Value Ref Range Status   09/12/2022 57 degrees Final     Calculated R Axis   Date Value Ref Range Status   09/12/2022 81 degrees Final     Calculated T Axis   Date Value Ref Range Status   09/12/2022 78 degrees Final     Airway:* No LDAs  found *  Blood pressure (!) 142/55, pulse 69, temperature 36.4 C (97.5 F), resp. rate (!) 10, height 1.6 m (5\' 3" ), weight 46.3 kg (102 lb), SpO2 100%.

## 2022-10-28 NOTE — ED Nurses Note (Addendum)
Patient received to room 13 with complaints of Confusion and lethargy. Pt was seen in surgery today to remove bone spurs and repair rotator cuff of the right arm. After surgery pt was placed on an evolve ball with morphine in it. Pt is oriented x 4 but is lethargic. Responds to voice. Not complaining of any pain at the moment. Patient placed on monitor, VSS, assessment and history completed. Call light in hand, encouraged to call for any needs. Awaiting providers orders at this time.

## 2022-10-28 NOTE — ED Nurses Note (Signed)
Pt spouse back to desk again asking to sign out. Explained again that we want to observe breathing to ensure sedation is better. Pt still lethargic but alert and oriented when woken. Explained risks and agreeable to stay at this time.

## 2022-10-28 NOTE — ED Nurses Note (Signed)
Pt spouse back to desk again stating wife is awake and wanting to leave. Pt is alert and oriented x 4. Explained risks of leaving without observation again and pt still wants to sign out.

## 2022-10-31 DIAGNOSIS — R109 Unspecified abdominal pain: Secondary | ICD-10-CM

## 2022-10-31 DIAGNOSIS — R9431 Abnormal electrocardiogram [ECG] [EKG]: Secondary | ICD-10-CM

## 2022-10-31 LAB — ECG 12 LEAD
Atrial Rate: 73 {beats}/min
PR Interval: 178 ms
QT Interval: 422 ms
QTC Calculation: 464 ms
Ventricular rate: 73 {beats}/min

## 2022-11-16 ENCOUNTER — Encounter (INDEPENDENT_AMBULATORY_CARE_PROVIDER_SITE_OTHER): Payer: Self-pay | Admitting: NURSE PRACTITIONER

## 2023-01-12 ENCOUNTER — Encounter (INDEPENDENT_AMBULATORY_CARE_PROVIDER_SITE_OTHER): Payer: Self-pay | Admitting: OTOLARYNGOLOGY

## 2023-01-12 ENCOUNTER — Ambulatory Visit: Payer: Medicare Other | Attending: OTOLARYNGOLOGY | Admitting: OTOLARYNGOLOGY

## 2023-01-12 ENCOUNTER — Other Ambulatory Visit: Payer: Self-pay

## 2023-01-12 VITALS — Ht 63.0 in | Wt 102.0 lb

## 2023-01-12 DIAGNOSIS — H6120 Impacted cerumen, unspecified ear: Secondary | ICD-10-CM

## 2023-01-12 DIAGNOSIS — K219 Gastro-esophageal reflux disease without esophagitis: Secondary | ICD-10-CM

## 2023-01-12 DIAGNOSIS — J300 Vasomotor rhinitis: Secondary | ICD-10-CM | POA: Insufficient documentation

## 2023-01-12 DIAGNOSIS — H6123 Impacted cerumen, bilateral: Secondary | ICD-10-CM

## 2023-01-12 DIAGNOSIS — H903 Sensorineural hearing loss, bilateral: Secondary | ICD-10-CM | POA: Insufficient documentation

## 2023-01-12 DIAGNOSIS — H9319 Tinnitus, unspecified ear: Secondary | ICD-10-CM | POA: Insufficient documentation

## 2023-01-12 MED ORDER — OFLOXACIN 0.3 % EAR DROPS
4.0000 [drp] | Freq: Two times a day (BID) | OTIC | 0 refills | Status: AC
Start: 2023-01-12 — End: 2023-01-19

## 2023-01-12 NOTE — H&P (Signed)
[facility], [facility]  [address]  [place] [address]  Operated by [facility]  Return Patient Visit    Name: Lorraine Rojas MRN:  [id number]   Date: 01/12/2023 DOB: 1945/10/27 (77 y.o.)       Referring Provider:  No ref. provider found    Reason for Visit:   Chief Complaint   Patient presents with    Follow Up 6 Months     6 month rc.         History of Present Illness:  Lorraine Rojas is a 77 y.o. female who is FU on ears, LPR and VMR. She c/o aural fullness and clogging. Has not needed Atrovent spray lately. She is alternating omeprazole and Pepcid, and wishes to get off omeprazole d/t longterm effects. Denies dysphagia      Patient History:  Patient Active Problem List   Diagnosis    Peripheral vascular disease, unspecified (CMS HCC)     Current Outpatient Medications   Medication Sig    atorvastatin (LIPITOR) 20 mg Oral Tablet 1 Tablet (20 mg total) Once a day    Benzonatate (TESSALON) 200 mg Oral Capsule Take 1 Capsule (200 mg total) by mouth Three times a day as needed    calcium carbonate/vitamin D3 (CALCIUM 600 + D ORAL) Take by mouth Once a day    COQ10, UBIQUINOL, ORAL Take by mouth Once a day    dilTIAZem (CARDIZEM CD) 120 mg Oral Capsule, Sust. Release 24 hr Take 1 Capsule (120 mg total) by mouth Once a day    famotidine (PEPCID) 40 mg Oral Tablet Take 1 Tablet (40 mg total) by mouth Twice daily    HYDROcodone-acetaminophen (NORCO) 7.5-325 mg Oral Tablet Take 1 Tablet by mouth Every 6 hours as needed for Pain    ipratropium bromide (ATROVENT) 21 mcg (0.03 %) Nasal nasal spray Administer 2 Sprays into affected nostril(s) Twice per day as needed    loratadine (CLARITIN) 10 mg Oral Tablet Take 1 Tablet (10 mg total) by mouth Once per day as needed    losartan (COZAAR) 100 mg Oral Tablet Take 1 Tablet (100 mg total) by mouth Once a day    montelukast (SINGULAIR) 10 mg Oral Tablet Take 1 Tablet (10 mg total) by mouth Once per day as needed    ofloxacin (FLOXIN) 0.3 % Otic  Drops Otic Solution Administer 4 Drops into the right ear Twice daily for 7 days    omeprazole (PRILOSEC) 40 mg Oral Capsule, Delayed Release(E.C.) Take 1 Capsule (40 mg total) by mouth Once a day      Allergies   Allergen Reactions    Thimerosal  Other Adverse Reaction (Add comment)    Clarithromycin Diarrhea     Past Medical History:   Diagnosis Date    Esophageal reflux     Essential hypertension     GERD (gastroesophageal reflux disease)     HLD (hyperlipidemia)     Mitral valve regurgitation     Peripheral vascular disease, unspecified (CMS HCC)      Past Surgical History:   Procedure Laterality Date    HX CATARACT REMOVAL Bilateral     HX HIP REPLACEMENT      HX HYSTERECTOMY      HX SEPTOPLASTY      VENOUS THROMBECTOMY       Family Medical History:       Problem Relation (Age of Onset)    Diabetes Mother    Heart Attack Father  No Known Problems Sister, Brother, Maternal Aunt, Maternal Uncle, Paternal Aunt, Paternal Uncle, Maternal Grandmother, Maternal Grandfather, Paternal Grandmother, Paternal Grandfather, Daughter, Son, Other            Social History     Tobacco Use    Smoking status: Former     Current packs/day: 0.00     Types: Cigarettes     Quit date: 1975     Years since quitting: 49.8    Smokeless tobacco: Never   Vaping Use    Vaping status: Never Used   Substance Use Topics    Alcohol use: Yes     Comment: holidays/special occasions    Drug use: Never       Review of Systems:  Review of Systems   Constitutional: Negative.        Physical Exam:  Ht 1.6 m (5\' 3" )   Wt 46.3 kg (102 lb)   BMI 18.07 kg/m       ENT Physical Exam  Constitutional  Appearance: patient appears well-developed, well-nourished and well-groomed,  Communication/Voice: communication appropriate for developmental age; vocal quality normal;  Head and Face  Appearance: head appears normal, face appears normal and face appears atraumatic;  Palpation: facial palpation normal;  Salivary: glands normal;  Ear  Hearing:  intact;  Auricles: right auricle normal; left auricle normal;  External Mastoids: right external mastoid normal; left external mastoid normal;  Ear Canals: bilateral ear canals impacted cerumen observed;  Tympanic Membranes: right tympanic membrane normal; left tympanic membrane normal;  Nose  External Nose: nares patent bilaterally; external nose normal;  Internal Nose: nasal mucosa normal; septum normal; bilateral inferior turbinates normal;  Oral Cavity/Oropharynx  Lips: normal;  Teeth: normal;  Gums: gingiva normal;  Tongue: normal;  Oral mucosa: normal;  Hard palate: normal;  Neck  Neck: neck normal; neck palpation normal;  Thyroid: thyroid normal;  Respiratory  Inspection: breathing unlabored; normal breathing rate;  Lymphatic  Palpation: lymph nodes normal;  Neurovestibular  Mental Status: alert and oriented;  Psychiatric: mood normal; affect is appropriate;  Cranial Nerves: cranial nerves intact;       Assessment:  ENCOUNTER DIAGNOSES     ICD-10-CM   1. Tinnitus, unspecified laterality  H93.19   2. 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 Impacted cerumen, unspecified laterality  H61.20       Plan:  Medical records reviewed on 01/12/2023.  AU cerumen debrided. Pt wishes to taper off omeprazole. Cont Pepcid and Atrovent. Will give ofloxacin for left ear eschar.  Orders Placed This Encounter    [phone] - REMOVAL IMPACTED CERUMEN W/ INSTRUMENT, UNILATERAL (AMB ONLY-PD)    31575 - LARYNGOSCOPY, FLEXIBLE DIAGNOSTIC (AMB ONLY)    ofloxacin (FLOXIN) 0.3 % Otic Drops Otic Solution     Return in about 3 months (around 04/14/2023).    Marcelline Deist, PA-C  The advanced practice clinician's documentation was reviewed/amended in its entirety with the assessment and plan portion completely performed independently by me during this separate encounter.

## 2023-01-12 NOTE — Procedures (Signed)
[facility]  [address]  [place] [address]  Operated by [facility]  Procedure Note    Name: Lorraine Rojas MRN:  [id number]   Date: 01/12/2023 DOB:  August 07, 1945 (77 y.o.)         [phone] - REMOVAL IMPACTED CERUMEN W/ INSTRUMENT, UNILATERAL (AMB ONLY-PD)    Performed by: Conchita Paris, DO  Authorized by: Conchita Paris, DO    Time Out:     Immediately before the procedure, a time out was called:  Yes    Patient verified:  Yes    Procedure Verified:  Yes    Site Verified:  Yes  Documentation:      Procedure: Cerumen cleaning  Pre-op Dx: Cerumen impaction      Bilateral EAC(s) examined under binocular microscopy.  Cerumen and/or debris was cleaned from the canal(s) using curettes, suction, and alligator forceps.  Patient tolerated procedure well.    [id number] - LARYNGOSCOPY, FLEXIBLE DIAGNOSTIC (AMB ONLY)    Performed by: Conchita Paris, DO  Authorized by: Conchita Paris, DO    Time Out:     Immediately before the procedure, a time out was called:  Yes    Patient verified:  Yes    Procedure Verified:  Yes    Site Verified:  Yes  Documentation:      [facility]  [address]  [place] [address]  Operated by [facility]  Procedure Note    Name: Lorraine Rojas MRN:  [id number]  Date: 01/12/2023 DOB:  Dec 27, 1945 (77 y.o.)        @PROCDOC @    Indications for procedure: GERD / LPR management    Anesthesia: Oxymetazoline nasal spray    Description: The flexible endoscope was gently introduced into the nostril and passed along the floor of the nose to the nasopharynx. Adenoid was minimal and eustachian tubes normal. The retropalatal airway was patent.    The endoscope was passed to the oropharynx. Base of tongue displayed normal lingual tonsils, patent valelulla, and sharply defined upright epiglottis. Retrolingual airway was patent.    The larynx displayed normal true vocal cords with good mobility. False cords were normal. Arytenoid mucosa was pink with no  edema.     The piriform recesses were symmetric without secretion. The hypopharynx was symmetric without lesion.    Findings: Laryngopharyngeal Reflux    The patient tolerated the procedure well.    Conchita Paris, DO                 Lorraine Defrank [place], DO

## 2023-01-23 ENCOUNTER — Ambulatory Visit: Payer: Medicare Other | Attending: OTOLARYNGOLOGY | Admitting: OTOLARYNGOLOGY

## 2023-01-23 ENCOUNTER — Encounter (INDEPENDENT_AMBULATORY_CARE_PROVIDER_SITE_OTHER): Payer: Self-pay | Admitting: OTOLARYNGOLOGY

## 2023-01-23 ENCOUNTER — Other Ambulatory Visit: Payer: Self-pay

## 2023-01-23 VITALS — Ht 63.0 in | Wt 102.0 lb

## 2023-01-23 DIAGNOSIS — J300 Vasomotor rhinitis: Secondary | ICD-10-CM | POA: Insufficient documentation

## 2023-01-23 DIAGNOSIS — H9319 Tinnitus, unspecified ear: Secondary | ICD-10-CM | POA: Insufficient documentation

## 2023-01-23 DIAGNOSIS — H6120 Impacted cerumen, unspecified ear: Secondary | ICD-10-CM | POA: Insufficient documentation

## 2023-01-23 DIAGNOSIS — K219 Gastro-esophageal reflux disease without esophagitis: Secondary | ICD-10-CM | POA: Insufficient documentation

## 2023-01-23 DIAGNOSIS — H938X1 Other specified disorders of right ear: Secondary | ICD-10-CM

## 2023-01-23 DIAGNOSIS — H903 Sensorineural hearing loss, bilateral: Secondary | ICD-10-CM | POA: Insufficient documentation

## 2023-01-23 NOTE — Procedures (Signed)
[facility]  [address]  [place] [address]  Operated by [facility]  Procedure Note    Name: Lorraine Rojas MRN:  [id number]   Date: 01/23/2023 DOB:  14-Aug-1945 (77 y.o.)         [phone] - REMOVAL IMPACTED CERUMEN W/ INSTRUMENT, UNILATERAL (AMB ONLY-PD)    Performed by: Conchita Paris, DO  Authorized by: Conchita Paris, DO    Time Out:     Immediately before the procedure, a time out was called:  Yes    Patient verified:  Yes    Procedure Verified:  Yes    Site Verified:  Yes  Documentation:      Procedure: Cerumen cleaning  Pre-op Dx: Cerumen impaction      Right EAC(s) examined under binocular microscopy.  Cerumen and/or debris was cleaned from the canal(s) using curettes, suction, and alligator forceps.  Patient tolerated procedure well.        Conchita Paris, DO

## 2023-01-23 NOTE — H&P (Signed)
[facility]  [address]  [place] [address]  Operated by [facility]  Return Patient Visit    Name: Lorraine Rojas MRN:  [id number]   Date: 01/23/2023 DOB: 06/07/1945 (77 y.o.)       Referring Provider:  Phil Dopp, DO    Reason for Visit:   Chief Complaint   Patient presents with    Ear Problem(s)     Complains of fullness in right ear since having ear cleaned out a week ago.         History of Present Illness:  Lorraine Rojas is a 77 y.o. female who is FU on ears, LPR and VMR. She c/o R ear clogging after cleaning. She has been using ofoxacin gtts.       Patient History:  Patient Active Problem List   Diagnosis    Peripheral vascular disease, unspecified (CMS HCC)     Current Outpatient Medications   Medication Sig    atorvastatin (LIPITOR) 20 mg Oral Tablet 1 Tablet (20 mg total) Once a day    Benzonatate (TESSALON) 200 mg Oral Capsule Take 1 Capsule (200 mg total) by mouth Three times a day as needed    calcium carbonate/vitamin D3 (CALCIUM 600 + D ORAL) Take by mouth Once a day    COQ10, UBIQUINOL, ORAL Take by mouth Once a day    dilTIAZem (CARDIZEM CD) 120 mg Oral Capsule, Sust. Release 24 hr Take 1 Capsule (120 mg total) by mouth Once a day    famotidine (PEPCID) 40 mg Oral Tablet Take 1 Tablet (40 mg total) by mouth Twice daily    HYDROcodone-acetaminophen (NORCO) 7.5-325 mg Oral Tablet Take 1 Tablet by mouth Every 6 hours as needed for Pain    ipratropium bromide (ATROVENT) 21 mcg (0.03 %) Nasal nasal spray Administer 2 Sprays into affected nostril(s) Twice per day as needed    loratadine (CLARITIN) 10 mg Oral Tablet Take 1 Tablet (10 mg total) by mouth Once per day as needed    losartan (COZAAR) 100 mg Oral Tablet Take 1 Tablet (100 mg total) by mouth Once a day    montelukast (SINGULAIR) 10 mg Oral Tablet Take 1 Tablet (10 mg total) by mouth Once per day as needed    omeprazole (PRILOSEC) 40 mg Oral Capsule, Delayed Release(E.C.) Take 1 Capsule (40 mg  total) by mouth Once a day      Allergies   Allergen Reactions    Thimerosal  Other Adverse Reaction (Add comment)    Clarithromycin Diarrhea     Past Medical History:   Diagnosis Date    Esophageal reflux     Essential hypertension     GERD (gastroesophageal reflux disease)     HLD (hyperlipidemia)     Mitral valve regurgitation     Peripheral vascular disease, unspecified (CMS HCC)      Past Surgical History:   Procedure Laterality Date    HX CATARACT REMOVAL Bilateral     HX HIP REPLACEMENT      HX HYSTERECTOMY      HX SEPTOPLASTY      VENOUS THROMBECTOMY       Family Medical History:       Problem Relation (Age of Onset)    Diabetes Mother    Heart Attack Father    No Known Problems Sister, Brother, Maternal Aunt, Maternal Uncle, Paternal Aunt, Paternal Uncle, Maternal Grandmother, Maternal Grandfather, Paternal Grandmother, Paternal Grandfather, Daughter, Son, Other  Social History     Tobacco Use    Smoking status: Former     Current packs/day: 0.00     Types: Cigarettes     Quit date: 1975     Years since quitting: 49.9    Smokeless tobacco: Never   Vaping Use    Vaping status: Never Used   Substance Use Topics    Alcohol use: Yes     Comment: holidays/special occasions    Drug use: Never       Review of Systems:  Review of Systems    Physical Exam:  Ht 1.6 m (5\' 3" )   Wt 46.3 kg (102 lb)   BMI 18.07 kg/m       ENT Physical Exam  Constitutional  Appearance: patient appears well-developed, well-nourished and well-groomed,  Communication/Voice: communication appropriate for developmental age; vocal quality normal;  Head and Face  Appearance: head appears normal, face appears normal and face appears atraumatic;  Palpation: facial palpation normal;  Salivary: glands normal;  Ear  Hearing: intact;  Auricles: right auricle normal; left auricle normal;  External Mastoids: right external mastoid normal; left external mastoid normal;  Ear Canals: Ear Canal comments: R EAC occluded with dry heme--  carefully debrided.  Tympanic Membranes: right tympanic membrane normal; left tympanic membrane normal;  Nose  External Nose: nares patent bilaterally; external nose normal;  Internal Nose: nasal mucosa normal; septum normal; bilateral inferior turbinates normal;  Oral Cavity/Oropharynx  Lips: normal;  Teeth: normal;  Gums: gingiva normal;  Tongue: normal;  Oral mucosa: normal;  Hard palate: normal;  Neck  Neck: neck normal; neck palpation normal;  Thyroid: thyroid normal;  Respiratory  Inspection: breathing unlabored; normal breathing rate;  Lymphatic  Palpation: lymph nodes normal;  Neurovestibular  Mental Status: alert and oriented;  Psychiatric: mood normal; affect is appropriate;  Cranial Nerves: cranial nerves intact;       Assessment:  ENCOUNTER DIAGNOSES     ICD-10-CM   1. Tinnitus, unspecified laterality  H93.19   2. 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 Impacted cerumen, unspecified laterality  H61.20       Plan:  Medical records reviewed on 01/23/2023.  AD debrided today. Cont ofloxacin gtts.   Continue Atrovent , omeprazole and pepcid  Orders Placed This Encounter    [phone] - REMOVAL IMPACTED CERUMEN W/ INSTRUMENT, UNILATERAL (AMB ONLY-PD)     Return in about 2 weeks (around 02/06/2023).    Marcelline Deist, PA-C  The advanced practice clinician's documentation was reviewed/amended in its entirety with the assessment and plan portion completely performed independently by me during this separate encounter.

## 2023-02-06 ENCOUNTER — Encounter (INDEPENDENT_AMBULATORY_CARE_PROVIDER_SITE_OTHER): Payer: Self-pay | Admitting: OTOLARYNGOLOGY

## 2023-02-06 ENCOUNTER — Ambulatory Visit: Payer: Medicare Other | Attending: OTOLARYNGOLOGY | Admitting: OTOLARYNGOLOGY

## 2023-02-06 ENCOUNTER — Other Ambulatory Visit: Payer: Self-pay

## 2023-02-06 VITALS — Ht 63.0 in | Wt 102.0 lb

## 2023-02-06 DIAGNOSIS — H6120 Impacted cerumen, unspecified ear: Secondary | ICD-10-CM | POA: Insufficient documentation

## 2023-02-06 DIAGNOSIS — K219 Gastro-esophageal reflux disease without esophagitis: Secondary | ICD-10-CM | POA: Insufficient documentation

## 2023-02-06 DIAGNOSIS — H903 Sensorineural hearing loss, bilateral: Secondary | ICD-10-CM | POA: Insufficient documentation

## 2023-02-06 DIAGNOSIS — J300 Vasomotor rhinitis: Secondary | ICD-10-CM | POA: Insufficient documentation

## 2023-02-06 DIAGNOSIS — H7291 Unspecified perforation of tympanic membrane, right ear: Secondary | ICD-10-CM | POA: Insufficient documentation

## 2023-02-06 DIAGNOSIS — H9319 Tinnitus, unspecified ear: Secondary | ICD-10-CM | POA: Insufficient documentation

## 2023-02-06 DIAGNOSIS — H6121 Impacted cerumen, right ear: Secondary | ICD-10-CM

## 2023-02-06 NOTE — H&P (Signed)
[facility]  [address]  [place] [address]  Operated by [facility]  Return Patient Visit    Name: Lorraine Rojas MRN:  [id number]   Date: 02/06/2023 DOB: 07/18/1945 (77 y.o.)       Referring Provider:  Phil Dopp, DO    Reason for Visit:   Chief Complaint   Patient presents with    Ear Problem(s)     2 week rc on right ear.        History of Present Illness:  Lorraine Rojas is a 77 y.o. female who is FU on ears. She used the ofloxacin gtts and the ear feels better.       Patient History:  Patient Active Problem List   Diagnosis    Peripheral vascular disease, unspecified (CMS HCC)     Current Outpatient Medications   Medication Sig    atorvastatin (LIPITOR) 20 mg Oral Tablet 1 Tablet (20 mg total) Once a day    Benzonatate (TESSALON) 200 mg Oral Capsule Take 1 Capsule (200 mg total) by mouth Three times a day as needed    calcium carbonate/vitamin D3 (CALCIUM 600 + D ORAL) Take by mouth Once a day    COQ10, UBIQUINOL, ORAL Take by mouth Once a day    dilTIAZem (CARDIZEM CD) 120 mg Oral Capsule, Sust. Release 24 hr Take 1 Capsule (120 mg total) by mouth Once a day    famotidine (PEPCID) 40 mg Oral Tablet Take 1 Tablet (40 mg total) by mouth Twice daily    HYDROcodone-acetaminophen (NORCO) 7.5-325 mg Oral Tablet Take 1 Tablet by mouth Every 6 hours as needed for Pain    ipratropium bromide (ATROVENT) 21 mcg (0.03 %) Nasal nasal spray Administer 2 Sprays into affected nostril(s) Twice per day as needed    loratadine (CLARITIN) 10 mg Oral Tablet Take 1 Tablet (10 mg total) by mouth Once per day as needed    losartan (COZAAR) 100 mg Oral Tablet Take 1 Tablet (100 mg total) by mouth Once a day    montelukast (SINGULAIR) 10 mg Oral Tablet Take 1 Tablet (10 mg total) by mouth Once per day as needed    omeprazole (PRILOSEC) 40 mg Oral Capsule, Delayed Release(E.C.) Take 1 Capsule (40 mg total) by mouth Once a day      Allergies   Allergen Reactions    Thimerosal  Other  Adverse Reaction (Add comment)    Clarithromycin Diarrhea     Past Medical History:   Diagnosis Date    Esophageal reflux     Essential hypertension     GERD (gastroesophageal reflux disease)     HLD (hyperlipidemia)     Mitral valve regurgitation     Peripheral vascular disease, unspecified (CMS HCC)      Past Surgical History:   Procedure Laterality Date    HX CATARACT REMOVAL Bilateral     HX HIP REPLACEMENT      HX HYSTERECTOMY      HX SEPTOPLASTY      VENOUS THROMBECTOMY       Family Medical History:       Problem Relation (Age of Onset)    Diabetes Mother    Heart Attack Father    No Known Problems Sister, Brother, Maternal Aunt, Maternal Uncle, Paternal Aunt, Paternal Uncle, Maternal Grandmother, Maternal Grandfather, Paternal Grandmother, Paternal Grandfather, Daughter, Son, Other            Social History     Tobacco Use  Smoking status: Former     Current packs/day: 0.00     Types: Cigarettes     Quit date: 1975     Years since quitting: 49.9    Smokeless tobacco: Never   Vaping Use    Vaping status: Never Used   Substance Use Topics    Alcohol use: Yes     Comment: holidays/special occasions    Drug use: Never       Review of Systems:  Review of Systems    Physical Exam:  Ht 1.6 m (5\' 3" )   Wt 46.3 kg (102 lb)   BMI 18.07 kg/m       ENT Physical Exam  Constitutional  Appearance: patient appears well-developed, well-nourished and well-groomed,  Communication/Voice: communication appropriate for developmental age; vocal quality normal;  Head and Face  Appearance: head appears normal, face appears normal and face appears atraumatic;  Palpation: facial palpation normal;  Salivary: glands normal;  Ear  Hearing: intact;  Auricles: right auricle normal; left auricle normal;  External Mastoids: right external mastoid normal; left external mastoid normal;  Ear Canals: Ear Canal comments: mild dry heme in R EAC-- gently debrided, EAC clear  Ear comments: Right 15 % anterior perforation.  Nose  External Nose:  nares patent bilaterally; external nose normal;  Internal Nose: nasal mucosa normal; septum normal; bilateral inferior turbinates normal;  Oral Cavity/Oropharynx  Lips: normal;  Teeth: normal;  Gums: gingiva normal;  Tongue: normal;  Oral mucosa: normal;  Hard palate: normal;  Neck  Neck: neck normal; neck palpation normal;  Thyroid: thyroid normal;  Respiratory  Inspection: breathing unlabored; normal breathing rate;  Lymphatic  Palpation: lymph nodes normal;  Neurovestibular  Mental Status: alert and oriented;  Psychiatric: mood normal; affect is appropriate;  Cranial Nerves: cranial nerves intact;       Assessment:  ENCOUNTER DIAGNOSES     ICD-10-CM   1. Tinnitus, unspecified laterality  H93.19   2. 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 Impacted cerumen, unspecified laterality  H61.20   6. Unspecified perforation of tympanic membrane, right ear  H72.91       Plan:  Medical records reviewed on 02/06/2023.  AD cerumen/dry heme carefully debrided. Pt can stop ofloxacin. Cont Atrovent. Will monitor perforation. Dry ear precautions discussed.     Return in about 4 weeks (around 03/06/2023).    Marcelline Deist, PA-C  The advanced practice clinician's documentation was reviewed/amended in its entirety with the assessment and plan portion completely performed independently by me during this separate encounter.

## 2023-03-28 ENCOUNTER — Other Ambulatory Visit: Payer: Self-pay

## 2023-03-28 ENCOUNTER — Encounter (INDEPENDENT_AMBULATORY_CARE_PROVIDER_SITE_OTHER): Payer: Self-pay | Admitting: OTOLARYNGOLOGY

## 2023-03-28 ENCOUNTER — Ambulatory Visit: Payer: Medicare Other | Attending: OTOLARYNGOLOGY | Admitting: OTOLARYNGOLOGY

## 2023-03-28 VITALS — Ht 63.0 in | Wt 102.0 lb

## 2023-03-28 DIAGNOSIS — Z87891 Personal history of nicotine dependence: Secondary | ICD-10-CM

## 2023-03-28 DIAGNOSIS — H9319 Tinnitus, unspecified ear: Secondary | ICD-10-CM | POA: Insufficient documentation

## 2023-03-28 DIAGNOSIS — K219 Gastro-esophageal reflux disease without esophagitis: Secondary | ICD-10-CM | POA: Insufficient documentation

## 2023-03-28 DIAGNOSIS — H903 Sensorineural hearing loss, bilateral: Secondary | ICD-10-CM | POA: Insufficient documentation

## 2023-03-28 DIAGNOSIS — H7291 Unspecified perforation of tympanic membrane, right ear: Secondary | ICD-10-CM | POA: Insufficient documentation

## 2023-03-28 DIAGNOSIS — J300 Vasomotor rhinitis: Secondary | ICD-10-CM | POA: Insufficient documentation

## 2023-03-28 NOTE — H&P (Signed)
[facility]  [address]  [place] [address]  Operated by [facility]  Return Patient Visit    Name: Lorraine Rojas MRN:  [id number]   Date: 03/28/2023 DOB: 1945/12/03 (77 y.o.)       Referring Provider:  Phil Dopp, DO    Reason for Visit:   Chief Complaint   Patient presents with    Ear Problem(s)     1 month rc on ears.         History of Present Illness:  Lorraine Rojas is a 78 y.o. female who is FU on ears. She used ear gtts in the right ear last night because she could feel dry skin.       Patient History:  Patient Active Problem List   Diagnosis    Peripheral vascular disease, unspecified (CMS HCC)     Current Outpatient Medications   Medication Sig    atorvastatin (LIPITOR) 20 mg Oral Tablet 1 Tablet (20 mg total) Once a day    Benzonatate (TESSALON) 200 mg Oral Capsule Take 1 Capsule (200 mg total) by mouth Three times a day as needed    calcium carbonate/vitamin D3 (CALCIUM 600 + D ORAL) Take by mouth Once a day    COQ10, UBIQUINOL, ORAL Take by mouth Once a day    dilTIAZem (CARDIZEM CD) 120 mg Oral Capsule, Sust. Release 24 hr Take 1 Capsule (120 mg total) by mouth Once a day    famotidine (PEPCID) 40 mg Oral Tablet Take 1 Tablet (40 mg total) by mouth Twice daily    HYDROcodone-acetaminophen (NORCO) 7.5-325 mg Oral Tablet Take 1 Tablet by mouth Every 6 hours as needed for Pain    ipratropium bromide (ATROVENT) 21 mcg (0.03 %) Nasal nasal spray Administer 2 Sprays into affected nostril(s) Twice per day as needed    loratadine (CLARITIN) 10 mg Oral Tablet Take 1 Tablet (10 mg total) by mouth Once per day as needed    losartan (COZAAR) 100 mg Oral Tablet Take 1 Tablet (100 mg total) by mouth Once a day    montelukast (SINGULAIR) 10 mg Oral Tablet Take 1 Tablet (10 mg total) by mouth Once per day as needed    omeprazole (PRILOSEC) 40 mg Oral Capsule, Delayed Release(E.C.) Take 1 Capsule (40 mg total) by mouth Once a day      Allergies   Allergen Reactions     Thimerosal  Other Adverse Reaction (Add comment)    Clarithromycin Diarrhea     Past Medical History:   Diagnosis Date    Esophageal reflux     Essential hypertension     GERD (gastroesophageal reflux disease)     HLD (hyperlipidemia)     Mitral valve regurgitation     Peripheral vascular disease, unspecified (CMS HCC)      Past Surgical History:   Procedure Laterality Date    HX CATARACT REMOVAL Bilateral     HX HIP REPLACEMENT      HX HYSTERECTOMY      HX SEPTOPLASTY      VENOUS THROMBECTOMY       Family Medical History:       Problem Relation (Age of Onset)    Diabetes Mother    Heart Attack Father    No Known Problems Sister, Brother, Maternal Aunt, Maternal Uncle, Paternal Aunt, Paternal Uncle, Maternal Grandmother, Maternal Grandfather, Paternal Grandmother, Paternal Grandfather, Daughter, Son, Other            Social History  Tobacco Use    Smoking status: Former     Current packs/day: 0.00     Types: Cigarettes     Quit date: 1975     Years since quitting: 50.0    Smokeless tobacco: Never   Vaping Use    Vaping status: Never Used   Substance Use Topics    Alcohol use: Yes     Comment: holidays/special occasions    Drug use: Never       Review of Systems:  Review of Systems    Physical Exam:  Ht 1.6 m (5\' 3" )   Wt 46.3 kg (102 lb)   BMI 18.07 kg/m       ENT Physical Exam  Constitutional  Appearance: patient appears well-developed, well-nourished and well-groomed,  Communication/Voice: communication appropriate for developmental age; vocal quality normal;  Head and Face  Appearance: head appears normal, face appears normal and face appears atraumatic;  Palpation: facial palpation normal;  Salivary: glands normal;  Ear  Hearing: intact;  Auricles: right auricle normal; left auricle normal;  External Mastoids: right external mastoid normal; left external mastoid normal;  Ear comments: Right 15 % anterior perforation.  Nose  External Nose: nares patent bilaterally; external nose normal;  Internal Nose:  nasal mucosa normal; septum normal; bilateral inferior turbinates normal;  Oral Cavity/Oropharynx  Lips: normal;  Teeth: normal;  Gums: gingiva normal;  Tongue: normal;  Oral mucosa: normal;  Hard palate: normal;  Neck  Neck: neck normal; neck palpation normal;  Thyroid: thyroid normal;  Respiratory  Inspection: breathing unlabored; normal breathing rate;  Lymphatic  Palpation: lymph nodes normal;  Neurovestibular  Mental Status: alert and oriented;  Psychiatric: mood normal; affect is appropriate;  Cranial Nerves: cranial nerves intact;       Assessment:  ENCOUNTER DIAGNOSES     ICD-10-CM   1. Tinnitus, unspecified laterality  H93.19   2. 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 Unspecified perforation of tympanic membrane, right ear  H72.91       Plan:  Medical records reviewed on 03/28/2023.  Tymps type A Au  Will check audiogram  Perforation appears healed.  Continue Singulair, Claritin and atrovent  Orders Placed This Encounter    [facility]     Return for Follow up after audiogram.    Marcelline Deist, PA-C  The advanced practice clinician's documentation was reviewed/amended in its entirety with the assessment and plan portion completely performed independently by me during this separate encounter.

## 2023-03-29 ENCOUNTER — Telehealth (INDEPENDENT_AMBULATORY_CARE_PROVIDER_SITE_OTHER): Payer: Self-pay | Admitting: OTOLARYNGOLOGY

## 2023-03-29 NOTE — Telephone Encounter (Signed)
Spoke with the patient and informed her of date and time of audio and follow up appointments

## 2023-04-13 ENCOUNTER — Ambulatory Visit (INDEPENDENT_AMBULATORY_CARE_PROVIDER_SITE_OTHER): Payer: Self-pay | Admitting: OTOLARYNGOLOGY

## 2023-05-23 ENCOUNTER — Ambulatory Visit (INDEPENDENT_AMBULATORY_CARE_PROVIDER_SITE_OTHER): Payer: Self-pay | Admitting: OTOLARYNGOLOGY

## 2023-07-25 ENCOUNTER — Encounter (INDEPENDENT_AMBULATORY_CARE_PROVIDER_SITE_OTHER): Payer: Self-pay | Admitting: OTOLARYNGOLOGY

## 2023-07-25 ENCOUNTER — Other Ambulatory Visit: Payer: Self-pay

## 2023-07-25 ENCOUNTER — Ambulatory Visit: Payer: Self-pay | Attending: OTOLARYNGOLOGY | Admitting: OTOLARYNGOLOGY

## 2023-07-25 VITALS — Ht 63.0 in | Wt 102.0 lb

## 2023-07-25 DIAGNOSIS — H9211 Otorrhea, right ear: Secondary | ICD-10-CM | POA: Insufficient documentation

## 2023-07-25 DIAGNOSIS — H7291 Unspecified perforation of tympanic membrane, right ear: Secondary | ICD-10-CM | POA: Insufficient documentation

## 2023-07-25 DIAGNOSIS — K219 Gastro-esophageal reflux disease without esophagitis: Secondary | ICD-10-CM | POA: Insufficient documentation

## 2023-07-25 DIAGNOSIS — H903 Sensorineural hearing loss, bilateral: Secondary | ICD-10-CM | POA: Insufficient documentation

## 2023-07-25 DIAGNOSIS — H9319 Tinnitus, unspecified ear: Secondary | ICD-10-CM | POA: Insufficient documentation

## 2023-07-25 DIAGNOSIS — J300 Vasomotor rhinitis: Secondary | ICD-10-CM | POA: Insufficient documentation

## 2023-07-25 DIAGNOSIS — Z87891 Personal history of nicotine dependence: Secondary | ICD-10-CM

## 2023-07-25 MED ORDER — CIPROFLOXACIN 0.3 %-DEXAMETHASONE 0.1 % EAR DROPS,SUSPENSION
4.0000 [drp] | Freq: Two times a day (BID) | OTIC | 0 refills | Status: AC
Start: 2023-07-25 — End: ?

## 2023-07-25 NOTE — H&P (Signed)
 E[facility], [facility]  [address]  [place] [address]  Operated by [facility]  Return Patient Visit    Name: Lorraine Rojas MRN:  [id number]   Date: 07/25/2023 DOB: [birth year] (77 y.o.)       Referring Provider:  Cass Clerk, DO    Reason for Visit:   Chief Complaint   Patient presents with    Follow-up After Testing     Rc after audio        History of Present Illness:  Lorraine Rojas is a 79 y.o. female who is FU on ears. 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 Tymps type A Au.  Patient states she has been having right sided otorrhea since yesterday.       Patient History:  Patient Active Problem List   Diagnosis    Peripheral vascular disease, unspecified (CMS HCC)     Current Outpatient Medications   Medication Sig    atorvastatin (LIPITOR) 20 mg Oral Tablet 1 Tablet (20 mg total) Daily    Benzonatate (TESSALON) 200 mg Oral Capsule Take 1 Capsule (200 mg total) by mouth Three times a day as needed    calcium carbonate/vitamin D3 (CALCIUM 600 + D ORAL) Take by mouth Once a day    ciprofloxacin -dexAMETHasone  (CIPRODEX ) 0.3-0.1 % Otic Drops, Suspension Administer 4 Drops into the right ear Twice daily Instill 4 drops into right ear twice daily    COQ10, UBIQUINOL, ORAL Take by mouth Once a day    dilTIAZem (CARDIZEM CD) 120 mg Oral Capsule, Sust. Release 24 hr Take 1 Capsule (120 mg total) by mouth Daily    famotidine  (PEPCID ) 40 mg Oral Tablet Take 1 Tablet (40 mg total) by mouth Twice daily    HYDROcodone -acetaminophen  (NORCO) 7.5-325 mg Oral Tablet Take 1 Tablet by mouth Every 6 hours as needed for Pain    ipratropium bromide  (ATROVENT ) 21 mcg (0.03 %) Nasal nasal spray Administer 2 Sprays into affected nostril(s) Twice per day as needed    loratadine (CLARITIN) 10 mg Oral Tablet Take 1 Tablet (10 mg total) by mouth Once per day as needed    losartan (COZAAR) 100 mg Oral Tablet Take 1 Tablet (100 mg total) by mouth Daily    montelukast (SINGULAIR) 10  mg Oral Tablet Take 1 Tablet (10 mg total) by mouth Once per day as needed    omeprazole (PRILOSEC) 40 mg Oral Capsule, Delayed Release(E.C.) Take 1 Capsule (40 mg total) by mouth Daily      Allergies   Allergen Reactions    Thimerosal  Other Adverse Reaction (Add comment)    Clarithromycin Diarrhea     Past Medical History:   Diagnosis Date    Esophageal reflux     Essential hypertension     GERD (gastroesophageal reflux disease)     HLD (hyperlipidemia)     Mitral valve regurgitation     Peripheral vascular disease, unspecified (CMS HCC)      Past Surgical History:   Procedure Laterality Date    HX CATARACT REMOVAL Bilateral     HX HIP REPLACEMENT      HX HYSTERECTOMY      HX SEPTOPLASTY      VENOUS THROMBECTOMY       Family Medical History:       Problem Relation (Age of Onset)    Diabetes Mother    Heart Attack Father    No Known Problems Sister, Brother, Maternal Aunt, Maternal Uncle, Paternal  Aunt, Paternal Uncle, Maternal Grandmother, Maternal Grandfather, Paternal Grandmother, Paternal Grandfather, Daughter, Son, Other            Social History     Tobacco Use    Smoking status: Former     Current packs/day: 0.00     Types: Cigarettes     Quit date: 1975     Years since quitting: 50.4    Smokeless tobacco: Never   Vaping Use    Vaping status: Never Used   Substance Use Topics    Alcohol use: Yes     Comment: holidays/special occasions    Drug use: Never       Review of Systems:  Review of Systems    Physical Exam:  Ht 1.6 m (5\' 3" )   Wt 46.3 kg (102 lb)   BMI 18.07 kg/m       ENT Physical Exam  Constitutional  Appearance: patient appears well-developed, well-nourished and well-groomed,  Communication/Voice: communication appropriate for developmental age; vocal quality normal;  Head and Face  Appearance: head appears normal, face appears normal and face appears atraumatic;  Palpation: facial palpation normal;  Salivary: glands normal;  Ear  Hearing: intact;  Auricles: right auricle normal; left auricle  normal;  External Mastoids: right external mastoid normal; left external mastoid normal;  Ear comments: Right 15 % anterior perforation.  Nose  External Nose: nares patent bilaterally; external nose normal;  Internal Nose: nasal mucosa normal; septum normal; bilateral inferior turbinates normal;  Oral Cavity/Oropharynx  Lips: normal;  Teeth: normal;  Gums: gingiva normal;  Tongue: normal;  Oral mucosa: normal;  Hard palate: normal;  Neck  Neck: neck normal; neck palpation normal;  Thyroid: thyroid normal;  Respiratory  Inspection: breathing unlabored; normal breathing rate;  Lymphatic  Palpation: lymph nodes normal;  Neurovestibular  Mental Status: alert and oriented;  Psychiatric: mood normal; affect is appropriate;  Cranial Nerves: cranial nerves intact;       Assessment:  ENCOUNTER DIAGNOSES     ICD-10-CM   1. Tinnitus, unspecified laterality  H93.19   2. 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 Unspecified perforation of tympanic membrane, right ear  H72.91   6. Otorrhea of right ear  H92.11       Plan:  Medical records reviewed on 07/25/2023.  Audiogram interpreted  Will start ciprodex .  Perforation appears healed.  Continue Singulair, Claritin and atrovent   Orders Placed This Encounter    ciprofloxacin -dexAMETHasone  (CIPRODEX ) 0.3-0.1 % Otic Drops, Suspension     Return in about 4 weeks (around 08/22/2023).    Evans Him, PA-C  The advanced practice clinician's documentation was reviewed/amended in its entirety with the assessment and plan portion completely performed independently by me during this separate encounter.

## 2023-08-22 ENCOUNTER — Other Ambulatory Visit: Payer: Self-pay

## 2023-08-22 ENCOUNTER — Encounter (INDEPENDENT_AMBULATORY_CARE_PROVIDER_SITE_OTHER): Payer: Self-pay | Admitting: OTOLARYNGOLOGY

## 2023-08-22 ENCOUNTER — Ambulatory Visit: Payer: Self-pay | Attending: OTOLARYNGOLOGY | Admitting: OTOLARYNGOLOGY

## 2023-08-22 VITALS — Ht 63.0 in | Wt 102.0 lb

## 2023-08-22 DIAGNOSIS — H7291 Unspecified perforation of tympanic membrane, right ear: Secondary | ICD-10-CM | POA: Insufficient documentation

## 2023-08-22 DIAGNOSIS — H9319 Tinnitus, unspecified ear: Secondary | ICD-10-CM | POA: Insufficient documentation

## 2023-08-22 DIAGNOSIS — K219 Gastro-esophageal reflux disease without esophagitis: Secondary | ICD-10-CM | POA: Insufficient documentation

## 2023-08-22 DIAGNOSIS — J300 Vasomotor rhinitis: Secondary | ICD-10-CM | POA: Insufficient documentation

## 2023-08-22 DIAGNOSIS — H9211 Otorrhea, right ear: Secondary | ICD-10-CM | POA: Insufficient documentation

## 2023-08-22 DIAGNOSIS — H903 Sensorineural hearing loss, bilateral: Secondary | ICD-10-CM | POA: Insufficient documentation

## 2023-08-22 NOTE — H&P (Signed)
 [facility]  [address]  [place] [address]  Operated by [facility]  Return Patient Visit    Name: Lorraine Rojas MRN:  [id number]   Date: 08/22/2023 DOB: 07/01/1945 (78 y.o.)       Referring Provider:  Cass Clerk, DO    Reason for Visit:   Chief Complaint   Patient presents with    Ear Problem(s)     4 week rc on ears.        History of Present Illness:  Lorraine Rojas is a 78 y.o. female who is FU on ears. Right otorrhea resolved with the Ciprodex . Ears feel back to baseline. No allergy/sinus complaints.         Patient History:  Problem List[1]    Current Outpatient Medications   Medication Sig    atorvastatin (LIPITOR) 20 mg Oral Tablet 1 Tablet (20 mg total) Daily    Benzonatate (TESSALON) 200 mg Oral Capsule Take 1 Capsule (200 mg total) by mouth Three times a day as needed    calcium carbonate/vitamin D3 (CALCIUM 600 + D ORAL) Take by mouth Once a day    ciprofloxacin -dexAMETHasone  (CIPRODEX ) 0.3-0.1 % Otic Drops, Suspension Administer 4 Drops into the right ear Twice daily Instill 4 drops into right ear twice daily    COQ10, UBIQUINOL, ORAL Take by mouth Once a day    dilTIAZem (CARDIZEM CD) 120 mg Oral Capsule, Sust. Release 24 hr Take 1 Capsule (120 mg total) by mouth Daily    famotidine  (PEPCID ) 40 mg Oral Tablet Take 1 Tablet (40 mg total) by mouth Twice daily    HYDROcodone -acetaminophen  (NORCO) 7.5-325 mg Oral Tablet Take 1 Tablet by mouth Every 6 hours as needed for Pain    ipratropium bromide  (ATROVENT ) 21 mcg (0.03 %) Nasal nasal spray Administer 2 Sprays into affected nostril(s) Twice per day as needed    loratadine (CLARITIN) 10 mg Oral Tablet Take 1 Tablet (10 mg total) by mouth Once per day as needed    losartan (COZAAR) 100 mg Oral Tablet Take 1 Tablet (100 mg total) by mouth Daily    montelukast (SINGULAIR) 10 mg Oral Tablet Take 1 Tablet (10 mg total) by mouth Once per day as needed    omeprazole (PRILOSEC) 40 mg Oral Capsule, Delayed  Release(E.C.) Take 1 Capsule (40 mg total) by mouth Daily      Allergies[2]  Past Medical History:   Diagnosis Date    Esophageal reflux     Essential hypertension     GERD (gastroesophageal reflux disease)     HLD (hyperlipidemia)     Mitral valve regurgitation     Peripheral vascular disease, unspecified (CMS HCC)       Past Surgical History:   Procedure Laterality Date    HX CATARACT REMOVAL Bilateral     HX HIP REPLACEMENT      HX HYSTERECTOMY      HX SEPTOPLASTY      VENOUS THROMBECTOMY        Family Medical History:       Problem Relation (Age of Onset)    Diabetes Mother    Heart Attack Father    No Known Problems Sister, Brother, Maternal Aunt, Maternal Uncle, Paternal Aunt, Paternal Uncle, Maternal Grandmother, Maternal Grandfather, Paternal Grandmother, Paternal Grandfather, Daughter, Son, Other            Social History[3]    Review of Systems:  Review of Systems    Physical Exam:  Ht  1.6 m (5' 3)   Wt 46.3 kg (102 lb)   BMI 18.07 kg/m       ENT Physical Exam  Constitutional  Appearance: patient appears well-developed, well-nourished and well-groomed,  Communication/Voice: communication appropriate for developmental age; vocal quality normal;  Head and Face  Appearance: head appears normal, face appears normal and face appears atraumatic;  Palpation: facial palpation normal;  Salivary: glands normal;  Ear  Hearing: intact;  Auricles: bilateral auricles normal;  External Mastoids: right external mastoid normal; left external mastoid normal;  Ear Canals: bilateral ear canals normal;  Tympanic Membranes: bilateral tympanic membranes normal;  Nose  External Nose: nares patent bilaterally; external nose normal;  Internal Nose: nasal mucosa normal; septum normal; bilateral inferior turbinates normal;  Oral Cavity/Oropharynx  Lips: normal;  Teeth: normal;  Gums: gingiva normal;  Tongue: normal;  Oral mucosa: normal;  Hard palate: normal;  Neck  Neck: neck normal; neck palpation normal;  Thyroid: thyroid  normal;  Respiratory  Inspection: breathing unlabored; normal breathing rate;  Lymphatic  Palpation: lymph nodes normal;  Neurovestibular  Mental Status: alert and oriented;  Psychiatric: mood normal; affect is appropriate;  Cranial Nerves: cranial nerves intact;         Assessment:  ENCOUNTER DIAGNOSES     ICD-10-CM   1. Tinnitus, unspecified laterality  H93.19   2. 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 Unspecified perforation of tympanic membrane, right ear  H72.91   6. Otorrhea of right ear  H92.11       Plan:  Medical records reviewed on 08/22/2023.  Cont Claritin, Singulair and Atrovent . TM intact bilaterally.     No orders of the defined types were placed in this encounter.     Return in about 6 months (around 02/21/2024).    Evans Him, PA-C  The advanced practice clinician's documentation was reviewed/amended in its entirety with the assessment and plan portion completely performed independently by me during this separate encounter.          [1]   Patient Active Problem List  Diagnosis    Peripheral vascular disease, unspecified (CMS HCC)   [2]   Allergies  Allergen Reactions    Thimerosal  Other Adverse Reaction (Add comment)    Clarithromycin Diarrhea   [3]   Social History  Tobacco Use    Smoking status: Former     Current packs/day: 0.00     Types: Cigarettes     Quit date: 1975     Years since quitting: 50.4    Smokeless tobacco: Never   Vaping Use    Vaping status: Never Used   Substance Use Topics    Alcohol use: Yes     Comment: holidays/special occasions    Drug use: Never

## 2023-09-20 ENCOUNTER — Other Ambulatory Visit (HOSPITAL_COMMUNITY): Payer: Self-pay | Admitting: FAMILY PRACTICE

## 2023-09-20 DIAGNOSIS — Z1231 Encounter for screening mammogram for malignant neoplasm of breast: Secondary | ICD-10-CM

## 2023-09-27 ENCOUNTER — Encounter (HOSPITAL_COMMUNITY): Payer: Self-pay

## 2023-09-27 ENCOUNTER — Other Ambulatory Visit: Payer: Self-pay

## 2023-09-27 ENCOUNTER — Ambulatory Visit
Admission: RE | Admit: 2023-09-27 | Discharge: 2023-09-27 | Disposition: A | Discharge status: Home / Self Care | Source: Ambulatory Visit | Attending: FAMILY PRACTICE | Admitting: FAMILY PRACTICE

## 2023-09-27 DIAGNOSIS — Z1231 Encounter for screening mammogram for malignant neoplasm of breast: Secondary | ICD-10-CM | POA: Insufficient documentation

## 2023-10-18 ENCOUNTER — Ambulatory Visit (INDEPENDENT_AMBULATORY_CARE_PROVIDER_SITE_OTHER): Payer: Self-pay | Admitting: NURSE PRACTITIONER

## 2023-11-08 ENCOUNTER — Encounter (INDEPENDENT_AMBULATORY_CARE_PROVIDER_SITE_OTHER): Payer: Self-pay | Admitting: NURSE PRACTITIONER

## 2023-11-08 ENCOUNTER — Other Ambulatory Visit: Payer: Self-pay

## 2023-11-08 ENCOUNTER — Ambulatory Visit: Payer: Self-pay | Attending: NURSE PRACTITIONER | Admitting: NURSE PRACTITIONER

## 2023-11-08 VITALS — BP 162/65 | HR 66 | Ht 63.0 in | Wt 102.0 lb

## 2023-11-08 DIAGNOSIS — Z23 Encounter for immunization: Secondary | ICD-10-CM | POA: Insufficient documentation

## 2023-11-08 DIAGNOSIS — I340 Nonrheumatic mitral (valve) insufficiency: Secondary | ICD-10-CM | POA: Insufficient documentation

## 2023-11-08 DIAGNOSIS — E785 Hyperlipidemia, unspecified: Secondary | ICD-10-CM | POA: Insufficient documentation

## 2023-11-08 DIAGNOSIS — I10 Essential (primary) hypertension: Secondary | ICD-10-CM | POA: Insufficient documentation

## 2023-11-08 LAB — ECG W INTERP (AMB USE ONLY)(MUSE,IN CLINIC)
Atrial Rate: 67 {beats}/min
Calculated P Axis: 75 degrees
Calculated R Axis: 79 degrees
Calculated T Axis: 91 degrees
PR Interval: 156 ms
QRS Duration: 74 ms
QT Interval: 434 ms
QTC Calculation: 458 ms
Ventricular rate: 67 {beats}/min

## 2023-11-08 MED ORDER — DILTIAZEM CD 120 MG CAPSULE,EXTENDED RELEASE 24 HR
240.0000 mg | ORAL_CAPSULE | Freq: Every day | ORAL | 3 refills | Status: DC
Start: 2023-11-08 — End: 2023-12-26

## 2023-11-08 NOTE — Progress Notes (Signed)
 [facility]     [place], 78 y.o. female  Date of Service: 11/08/2023  Date of Birth:  1945-06-12  PCP:  Manus Endo, MD  Chief Complaint   Patient presents with    Follow Up    Hypertension    Hyperlipidemia     Last Visit 09.2023        HPI:    The patient has no history of CAD.  Stress test and echocardiogram in February 2022 are unremarkable.  Echocardiogram showed LVH, mild LAE, EF 60 to 65% with grade 1 diastolic dysfunction.  Mild to moderate aortic regurgitation, mild PR, TR, and moderate MR.    11-13-20 The patient is here for routine f/u for valvular disease.  He denies any significant chest pains.  She continues to have shortness of breath with exertion.  She has to stop and rest when doing strenuous activity, but then she is able to continue.  No change from her baseline.  She does have lots of issues with vascular disease in her lower extremities.  She is seeing a specialist in [place].  She checks her blood pressure at home and numbers are usually stable, she has some anxiety related to checking her blood pressure so this affects her numbers sometimes. Labs July 2022 showed BUN 19, creatinine 1.1, sodium 140, potassium 3.9, total cholesterol 172, triglycerides 67, HDL 85, LDL 74.      11/17/21 The patient is here for yearly f/u.  She denies any chest pains or shortness of breath.  She does complain of some intermittent fatigue.  She states she is had this ever since she broke her hip a few years ago.  Sometimes she does well, but other times she has to stop and rest because she gets tired.  She did just get established with new PCP and has discussed her fatigue with him as well.  Lab work in January was stable showed total cholesterol 179, triglycerides 52, HDL 92, LDL 77, BUN 19, creatinine 0.97, sodium 141, potassium 4.2.      11/08/2023 the patient presents for follow-up hypertension, mitral insufficiency, aortic insufficiency.Her 12/2021 echocardiogram showed  concentric hypertrophy, EF 60%, no wall motion abnormalities.  Mildly dilated left atrium, mild mitral regurg no evidence of stenosis, no aortic valve stenosis no aortic regurg.  She denies any chest pain shortness for breath syncope or palpitations.  The blood pressure is elevated today.    EKG:  Sinus rhythm 67 beats per minute  LAB:     No results found for: CHOLESTEROL, HDLCHOL, LDLCHOL, LDLCHOLDIR, TRIG    No results found for: HA1C   Last BMP  (Last result in the past 2 years)        Na   K   Cl   CO2   BUN   Cr   Calcium   Glucose   Glucose-Fasting        10/10/22 1134 138   3.9   106   27   17   0.85   9.5   89                  Past Medical History:   Diagnosis Date    Esophageal reflux     Essential hypertension     GERD (gastroesophageal reflux disease)     HLD (hyperlipidemia)     Mitral valve regurgitation     Peripheral vascular disease, unspecified (CMS HCC)        Past Surgical  History:   Procedure Laterality Date    HX CATARACT REMOVAL Bilateral     HX HIP REPLACEMENT      HX HYSTERECTOMY      HX SEPTOPLASTY      VENOUS THROMBECTOMY         Current Outpatient Medications   Medication Sig    atorvastatin (LIPITOR) 20 mg Oral Tablet 1 Tablet (20 mg total) Daily    Benzonatate (TESSALON) 200 mg Oral Capsule Take 1 Capsule (200 mg total) by mouth Three times a day as needed    calcium carbonate/vitamin D3 (CALCIUM 600 + D ORAL) Take by mouth Once a day    ciprofloxacin -dexAMETHasone  (CIPRODEX ) 0.3-0.1 % Otic Drops, Suspension Administer 4 Drops into the right ear Twice daily Instill 4 drops into right ear twice daily    COQ10, UBIQUINOL, ORAL Take by mouth Once a day    dilTIAZem  (CARDIZEM  CD) 120 mg Oral Capsule, Sust. Release 24 hr Take 1 Capsule (120 mg total) by mouth Daily    famotidine  (PEPCID ) 40 mg Oral Tablet Take 1 Tablet (40 mg total) by mouth Twice daily    HYDROcodone -acetaminophen  (NORCO) 7.5-325 mg Oral Tablet Take 1 Tablet by mouth Every 6 hours as needed for Pain     ipratropium bromide  (ATROVENT ) 21 mcg (0.03 %) Nasal nasal spray Administer 2 Sprays into affected nostril(s) Twice per day as needed    loratadine (CLARITIN) 10 mg Oral Tablet Take 1 Tablet (10 mg total) by mouth Once per day as needed    losartan (COZAAR) 100 mg Oral Tablet Take 1 Tablet (100 mg total) by mouth Daily    montelukast (SINGULAIR) 10 mg Oral Tablet Take 1 Tablet (10 mg total) by mouth Once per day as needed    omeprazole (PRILOSEC) 40 mg Oral Capsule, Delayed Release(E.C.) Take 1 Capsule (40 mg total) by mouth Daily     ROS: Other than issues noted in HPI, all other systems were negative.     Exam:  Vitals:    11/08/23 1508   Height: 1.6 m (5' 3)       General: No acute distress and appears stated age.    HEENT:Head normocephalic, atraumatic. ENT without erythema or injection, mucouse membranes moist.    Neck: No JVD, no carotid bruit. and supple, symmetrical, trachea midline.   Lungs: Clear to auscultation bilaterally.    Cardiovascular: Regular rate and rhythm, normal S1 S2, no murmur, no rub, or gallop, no thrill     Abdomen: Soft, non-tender and bowel sounds normal.    Extremities: Extremities normal, atraumatic, no cyanosis or edema.    Skin: Skin warm and dry.    Neurologic: Alert and oriented x3.  Psych: Mood and affect congruent for age and gender     Orders placed this visit:  Orders Placed This Encounter    EKG (In-Clinic Today)       Assessment/Plan:  Mitral valve insufficiency, unspecified etiology    Hypertension, unspecified type    Hyperlipidemia, unspecified hyperlipidemia type      Increase diltiazem  120 mg daily  Continue losartan 100 mg daily  Continue atorvastatin 20 mg daily  Echocardiogram   Return to clinic in 1 year if echo findings stable    Jeralyn Nolden, FNP-C 11/08/2023 15:11

## 2023-11-20 ENCOUNTER — Ambulatory Visit (INDEPENDENT_AMBULATORY_CARE_PROVIDER_SITE_OTHER): Payer: Self-pay | Admitting: NURSE PRACTITIONER

## 2023-11-20 ENCOUNTER — Ambulatory Visit: Payer: Self-pay | Attending: PHYSICIAN ASSISTANT | Admitting: NURSE PRACTITIONER

## 2023-11-20 ENCOUNTER — Other Ambulatory Visit: Payer: Self-pay

## 2023-11-20 ENCOUNTER — Encounter (INDEPENDENT_AMBULATORY_CARE_PROVIDER_SITE_OTHER): Payer: Self-pay | Admitting: NURSE PRACTITIONER

## 2023-11-20 VITALS — BP 186/67 | HR 66 | Ht 63.0 in | Wt 101.0 lb

## 2023-11-20 DIAGNOSIS — E785 Hyperlipidemia, unspecified: Secondary | ICD-10-CM | POA: Insufficient documentation

## 2023-11-20 DIAGNOSIS — I340 Nonrheumatic mitral (valve) insufficiency: Secondary | ICD-10-CM | POA: Insufficient documentation

## 2023-11-20 DIAGNOSIS — I10 Essential (primary) hypertension: Secondary | ICD-10-CM | POA: Insufficient documentation

## 2023-11-20 DIAGNOSIS — F419 Anxiety disorder, unspecified: Secondary | ICD-10-CM | POA: Insufficient documentation

## 2023-11-20 LAB — ECG 12 LEAD W/ INTERP (AMB USE ONLY) (MUSE, IN CLINC) (93005/93010)
Atrial Rate: 61 {beats}/min
Calculated P Axis: 64 degrees
Calculated R Axis: 75 degrees
Calculated T Axis: 89 degrees
PR Interval: 152 ms
QRS Duration: 78 ms
QT Interval: 430 ms
QTC Calculation: 432 ms
Ventricular rate: 61 {beats}/min

## 2023-11-20 MED ORDER — TELMISARTAN 80 MG TABLET
80.0000 mg | ORAL_TABLET | Freq: Every day | ORAL | 4 refills | Status: AC
Start: 2023-11-20 — End: ?

## 2023-11-20 NOTE — Progress Notes (Signed)
 [facility]     Lorraine Rojas, Lorraine Rojas [place], 78 y.o. female  Date of Service: 11/20/2023  Date of Birth:  1946/02/22  PCP:  Manus Endo, MD  Chief Complaint   Patient presents with    Follow Up     PVD  Pt c/o BP problems        HPI:    The patient has no history of CAD.  Stress test and echocardiogram in February 2022 are unremarkable.  Echocardiogram showed LVH, mild LAE, EF 60 to 65% with grade 1 diastolic dysfunction.  Mild to moderate aortic regurgitation, mild PR, TR, and moderate MR.    11/08/2023 the patient presents for follow-up hypertension, mitral insufficiency, aortic insufficiency. Her 12/2021 echocardiogram showed concentric hypertrophy, EF 60%, no wall motion abnormalities.  Mildly dilated left atrium, mild mitral regurg no evidence of stenosis, no aortic valve stenosis no aortic regurg.  She denies any chest pain shortness for breath syncope or palpitations.  The blood pressure is elevated today.    11/20/23 The patient is here for f/u from blood pressure issues.  She was told to increase her diltiazem  last visit, but at home her blood pressures were still fluctuating and she was concerned with the higher dose.  She seems very anxious today, after discussing further her husband just recently passed 3 weeks ago.  She feels at fault for the issues after we discussed this at length.  I gave her a lot of reassurance, but I feel like this is contributing to her blood pressure issues.      EKG:  Normal sinus rhythm, rate 61  LAB: No new labs to review    Past Medical History:   Diagnosis Date    Esophageal reflux     Essential hypertension     GERD (gastroesophageal reflux disease)     HLD (hyperlipidemia)     Mitral valve regurgitation     Peripheral vascular disease, unspecified (CMS HCC)        Past Surgical History:   Procedure Laterality Date    HX CATARACT REMOVAL Bilateral     HX HIP REPLACEMENT      HX HYSTERECTOMY      HX SEPTOPLASTY      VENOUS THROMBECTOMY         Current  Outpatient Medications   Medication Sig    ascorbic acid (VITAMIN C) 1,000 mg Oral Tablet Take 1 Tablet (1,000 mg total) by mouth Daily    atorvastatin (LIPITOR) 20 mg Oral Tablet 1 Tablet (20 mg total) Daily    Benzonatate (TESSALON) 200 mg Oral Capsule Take 1 Capsule (200 mg total) by mouth Three times a day as needed    calcium carbonate/vitamin D3 (CALCIUM 600 + D ORAL) Take by mouth Once a day    ciprofloxacin -dexAMETHasone  (CIPRODEX ) 0.3-0.1 % Otic Drops, Suspension Administer 4 Drops into the right ear Twice daily Instill 4 drops into right ear twice daily (Patient not taking: Reported on 11/20/2023)    COQ10, UBIQUINOL, ORAL Take by mouth Once a day    dilTIAZem  (CARDIZEM  CD) 120 mg Oral Capsule, Sust. Release 24 hr Take 2 Capsules (240 mg total) by mouth Daily (Patient taking differently: Take 1 Capsule (120 mg total) by mouth Daily)    famotidine  (PEPCID ) 40 mg Oral Tablet Take 1 Tablet (40 mg total) by mouth Twice daily (Patient taking differently: Take 1 Tablet (40 mg total) by mouth Every evening)    HYDROcodone -acetaminophen  (NORCO) 7.5-325 mg Oral Tablet Take 1  Tablet by mouth Every 6 hours as needed for Pain    ipratropium bromide  (ATROVENT ) 21 mcg (0.03 %) Nasal nasal spray Administer 2 Sprays into affected nostril(s) Twice per day as needed    loratadine (CLARITIN) 10 mg Oral Tablet Take 1 Tablet (10 mg total) by mouth Once per day as needed    montelukast (SINGULAIR) 10 mg Oral Tablet Take 1 Tablet (10 mg total) by mouth Once per day as needed    telmisartan  (MICARDIS ) 80 mg Oral Tablet Take 1 Tablet (80 mg total) by mouth Daily     ROS: Other than issues noted in HPI, all other systems were negative.     Exam:  Vitals:    11/20/23 1443   BP: (!) 186/67   Pulse: 66   SpO2: 100%   Weight: 45.8 kg (101 lb)   Height: 1.6 m (5' 3)   BMI: 17.89       General: No acute distress and appears stated age.    HEENT:Head normocephalic, atraumatic. ENT without erythema or injection, mucouse membranes moist.     Neck: No JVD, no carotid bruit. and supple, symmetrical, trachea midline.   Lungs: Clear to auscultation bilaterally.    Cardiovascular: Regular rate and rhythm, normal S1 S2, no murmur, no rub, or gallop, no thrill     Abdomen: Soft, non-tender and bowel sounds normal.    Extremities: Extremities normal, atraumatic, no cyanosis or edema.    Skin: Skin warm and dry.    Neurologic: Alert and oriented x3.  Psych: Mood and affect congruent for age and gender     Orders placed this visit:  Orders Placed This Encounter    ECG 12 Lead w/ Interp ([facility]  - In Clinic, Same Day)    telmisartan  (MICARDIS ) 80 mg Oral Tablet       Assessment/Plan:  Hypertension, unspecified type    Hyperlipidemia, unspecified hyperlipidemia type    Mitral valve insufficiency, unspecified etiology    Anxiety    Continue atorvastatin 20 mg daily   Continue diltiazem  120 mg daily   Discontinue losartan 100 mg daily  Start telmisartan  80mg  daily    Change losartan to telmisartan .  Continue with 120 mg daily of diltiazem .  I think patient's blood pressure issues are likely more stemming from her anxiety from her husband's recent passing.  We discussed this at length.  She is going to continue monitoring blood pressure and call us  if she has any issues.  She is supposed to have echo scheduled in the fall from her most recent visit.  Return for regular scheduled follow up in 1 year.    Leandrew JINNY Batty, APRN,FNP-BC 11/20/2023 15:25

## 2023-11-24 ENCOUNTER — Telehealth (INDEPENDENT_AMBULATORY_CARE_PROVIDER_SITE_OTHER): Payer: Self-pay | Admitting: NURSE PRACTITIONER

## 2023-12-14 ENCOUNTER — Ambulatory Visit
Admission: RE | Admit: 2023-12-14 | Discharge: 2023-12-14 | Disposition: A | Discharge status: Home / Self Care | Payer: Self-pay | Source: Ambulatory Visit | Attending: NURSE PRACTITIONER | Admitting: NURSE PRACTITIONER

## 2023-12-14 ENCOUNTER — Other Ambulatory Visit: Payer: Self-pay

## 2023-12-14 DIAGNOSIS — I10 Essential (primary) hypertension: Secondary | ICD-10-CM | POA: Insufficient documentation

## 2023-12-14 DIAGNOSIS — I340 Nonrheumatic mitral (valve) insufficiency: Secondary | ICD-10-CM | POA: Insufficient documentation

## 2023-12-14 LAB — TRANSTHORACIC ECHOCARDIOGRAM - ADULT: EF MEASUREMENT VALUE: 60.9

## 2023-12-15 ENCOUNTER — Ambulatory Visit (INDEPENDENT_AMBULATORY_CARE_PROVIDER_SITE_OTHER): Payer: Self-pay | Admitting: NURSE PRACTITIONER

## 2023-12-15 NOTE — Result Encounter Note (Signed)
 The pts echo shows mild- moderate mitral valve regurgitation. No intervention necessary at this time. If develops chest pain/ dyspnea/ syncope need evaluation

## 2023-12-26 ENCOUNTER — Encounter (INDEPENDENT_AMBULATORY_CARE_PROVIDER_SITE_OTHER): Payer: Self-pay | Admitting: INTERVENTIONAL CARDIOLOGY

## 2023-12-26 ENCOUNTER — Ambulatory Visit: Payer: Self-pay | Attending: INTERVENTIONAL CARDIOLOGY | Admitting: INTERVENTIONAL CARDIOLOGY

## 2023-12-26 ENCOUNTER — Other Ambulatory Visit: Payer: Self-pay

## 2023-12-26 VITALS — BP 142/68 | HR 80 | Ht 63.0 in | Wt 102.0 lb

## 2023-12-26 DIAGNOSIS — I10 Essential (primary) hypertension: Secondary | ICD-10-CM | POA: Insufficient documentation

## 2023-12-26 DIAGNOSIS — F419 Anxiety disorder, unspecified: Secondary | ICD-10-CM | POA: Insufficient documentation

## 2023-12-26 DIAGNOSIS — I340 Nonrheumatic mitral (valve) insufficiency: Secondary | ICD-10-CM | POA: Insufficient documentation

## 2023-12-26 DIAGNOSIS — E785 Hyperlipidemia, unspecified: Secondary | ICD-10-CM | POA: Insufficient documentation

## 2023-12-26 MED ORDER — DILTIAZEM CD 120 MG CAPSULE,EXTENDED RELEASE 24 HR: 120.0000 mg | ORAL_CAPSULE | Freq: Every day | ORAL | Status: DC

## 2023-12-26 NOTE — Progress Notes (Signed)
 [facility]     [place], [place], 78 y.o. female  Date of Service: 12/26/2023  Date of Birth:  13-Dec-1945  PCP:  Manus Endo, MD  Chief Complaint   Patient presents with    Follow-up After Testing     ECHO    Hypertension    Hyperlipidemia        HPI:    The patient has no history of CAD.  Stress test and echocardiogram in February 2022 are unremarkable.  Echocardiogram showed LVH, mild LAE, EF 60 to 65% with grade 1 diastolic dysfunction.  Mild to moderate aortic regurgitation, mild PR, TR, and moderate MR.    11/08/2023 The patient presents for follow-up hypertension, mitral insufficiency, aortic insufficiency. Her 12/2021 echocardiogram showed concentric hypertrophy, EF 60%, no wall motion abnormalities.  Mildly dilated left atrium, mild mitral regurg no evidence of stenosis, no aortic valve stenosis no aortic regurg.  She denies any chest pain shortness for breath syncope or palpitations.  The blood pressure is elevated today.    11/20/23 The patient is here for f/u from blood pressure issues.  She was told to increase her diltiazem  last visit, but at home her blood pressures were still fluctuating and she was concerned with the higher dose.  She seems very anxious today, after discussing further her husband just recently passed 3 weeks ago.  She feels at fault for the issues after we discussed this at length.  I gave her a lot of reassurance, but I feel like this is contributing to her blood pressure issues.      12/26/23 The patient is here for f/u for echo results.  Echo 12/14/23 showed preserved EF with mild to moderate MR.  She is still having some blood pressure fluctuations, but after discussing further, sounds like she has some inconsistencies with taking her medications.  Overall, it sounds like her numbers are stable.  At times, she reports some lower numbers on the bottom.  Her son is with her today and it sounds like she is also having some memory issues as well.  She  has an appointment to see Dr. IVAR Galloway, but has not seen him yet and he is a little apprehensive.    EKG:  Sinus rhythm with sinus arrhythmia, rate 97  LAB: No recent labs    Past Medical History:   Diagnosis Date    Esophageal reflux     Essential hypertension     GERD (gastroesophageal reflux disease)     HLD (hyperlipidemia)     Mitral valve regurgitation     Peripheral vascular disease, unspecified (CMS HCC)        Past Surgical History:   Procedure Laterality Date    HX CATARACT REMOVAL Bilateral     HX HIP REPLACEMENT      HX HYSTERECTOMY      HX SEPTOPLASTY      VENOUS THROMBECTOMY         Current Outpatient Medications   Medication Sig    ascorbic acid (VITAMIN C) 1,000 mg Oral Tablet Take 1 Tablet (1,000 mg total) by mouth Daily    atorvastatin (LIPITOR) 20 mg Oral Tablet 1 Tablet (20 mg total) Daily    Benzonatate (TESSALON) 200 mg Oral Capsule Take 1 Capsule (200 mg total) by mouth Three times a day as needed    calcium carbonate/vitamin D3 (CALCIUM 600 + D ORAL) Take by mouth Once a day    ciprofloxacin -dexAMETHasone  (CIPRODEX ) 0.3-0.1 % Otic Drops, Suspension  Administer 4 Drops into the right ear Twice daily Instill 4 drops into right ear twice daily (Patient not taking: Reported on 12/26/2023)    COQ10, UBIQUINOL, ORAL Take by mouth Once a day    dilTIAZem  (CARDIZEM  CD) 120 mg Oral Capsule, Sust. Release 24 hr Take 1 Capsule (120 mg total) by mouth Daily    famotidine  (PEPCID ) 40 mg Oral Tablet Take 1 Tablet (40 mg total) by mouth Twice daily (Patient taking differently: Take 1 Tablet (40 mg total) by mouth Every evening)    HYDROcodone -acetaminophen  (NORCO) 7.5-325 mg Oral Tablet Take 1 Tablet by mouth Every 6 hours as needed for Pain    ipratropium bromide  (ATROVENT ) 21 mcg (0.03 %) Nasal nasal spray Administer 2 Sprays into affected nostril(s) Twice per day as needed    loratadine (CLARITIN) 10 mg Oral Tablet Take 1 Tablet (10 mg total) by mouth Once per day as needed    montelukast (SINGULAIR) 10 mg  Oral Tablet Take 1 Tablet (10 mg total) by mouth Once per day as needed    mv-min/iron/folic/calcium/vitK (WOMEN'S MULTIVITAMIN ORAL) Take 1 Tablet by mouth Daily    polycarbophil calcium (FIBERCON) 625 mg Oral Tablet Take 1 Tablet (625 mg total) by mouth Daily    telmisartan  (MICARDIS ) 80 mg Oral Tablet Take 1 Tablet (80 mg total) by mouth Daily     ROS: Other than issues noted in HPI, all other systems were negative.     Exam:  Vitals:    12/26/23 1538   BP: (!) 142/68   Pulse: 80   SpO2: 98%   Weight: 46.3 kg (102 lb)   Height: 1.6 m (5' 3)   BMI: 18.07       General: No acute distress and appears stated age.    HEENT:Head normocephalic, atraumatic. ENT without erythema or injection, mucouse membranes moist.    Neck: No JVD, no carotid bruit. and supple, symmetrical, trachea midline.   Lungs: Clear to auscultation bilaterally.    Cardiovascular: Regular rate and rhythm, normal S1 S2, no murmur, no rub, or gallop, no thrill     Abdomen: Soft, non-tender and bowel sounds normal.    Extremities: Extremities normal, atraumatic, no cyanosis or edema.    Skin: Skin warm and dry.    Neurologic: Alert and oriented x3.  Psych: Mood and affect congruent for age and gender     Orders placed this visit:  Orders Placed This Encounter    ECG 12 Lead w/ Interp (MUSE  - In Clinic, Same Day)    dilTIAZem  (CARDIZEM  CD) 120 mg Oral Capsule, Sust. Release 24 hr       Assessment/Plan:  Hypertension    Hyperlipidemia    Anxiety    Mitral valve insufficiency, unspecified etiology    Continue atorvastatin 20 mg daily   Continue diltiazem  120 mg daily  Continue telmisartan  80 mg daily    Recommend patient using a weekly pill container to make sure she is not misses doses of her medications.  Also discussed again about adding SSRI given her continued anxiety and stress with her husband's passing, this is something she can discuss with PCP if symptoms do not improve.  Recommend continuing evaluation with Neurology.  Return in 1 year for  routine follow-up.    Leandrew JINNY Batty, APRN, CNP 12/26/2023 16:56

## 2023-12-27 DIAGNOSIS — I499 Cardiac arrhythmia, unspecified: Secondary | ICD-10-CM

## 2023-12-27 LAB — ECG 12 LEAD W/ INTERP (AMB USE ONLY) (MUSE, IN CLINC) (93005/93010)
Atrial Rate: 98 {beats}/min
Calculated P Axis: 75 degrees
Calculated R Axis: 78 degrees
Calculated T Axis: 65 degrees
PR Interval: 192 ms
QRS Duration: 82 ms
QT Interval: 366 ms
QTC Calculation: 464 ms
Ventricular rate: 97 {beats}/min

## 2024-02-19 ENCOUNTER — Ambulatory Visit (INDEPENDENT_AMBULATORY_CARE_PROVIDER_SITE_OTHER): Payer: Self-pay | Admitting: OTOLARYNGOLOGY

## 2024-03-19 ENCOUNTER — Other Ambulatory Visit (HOSPITAL_COMMUNITY): Payer: Self-pay | Admitting: PSYCHIATRY AND NEUROLOGY-NEUROLOGY

## 2024-03-19 DIAGNOSIS — I10 Essential (primary) hypertension: Secondary | ICD-10-CM

## 2024-03-19 DIAGNOSIS — R42 Dizziness and giddiness: Secondary | ICD-10-CM

## 2024-04-04 ENCOUNTER — Other Ambulatory Visit (HOSPITAL_COMMUNITY): Payer: Self-pay | Admitting: FAMILY PRACTICE

## 2024-04-04 ENCOUNTER — Other Ambulatory Visit (HOSPITAL_COMMUNITY): Payer: Self-pay | Admitting: PSYCHIATRY AND NEUROLOGY-NEUROLOGY

## 2024-04-04 DIAGNOSIS — I10 Essential (primary) hypertension: Secondary | ICD-10-CM

## 2024-04-04 DIAGNOSIS — R42 Dizziness and giddiness: Secondary | ICD-10-CM

## 2024-04-04 DIAGNOSIS — I6521 Occlusion and stenosis of right carotid artery: Secondary | ICD-10-CM

## 2024-04-11 ENCOUNTER — Ambulatory Visit: Admitting: PHYSICIAN ASSISTANT

## 2024-04-11 ENCOUNTER — Encounter (INDEPENDENT_AMBULATORY_CARE_PROVIDER_SITE_OTHER): Payer: Self-pay | Admitting: INTERVENTIONAL CARDIOLOGY

## 2024-04-11 ENCOUNTER — Other Ambulatory Visit: Payer: Self-pay

## 2024-04-11 VITALS — BP 166/67 | HR 69 | Ht 63.0 in | Wt 98.0 lb

## 2024-04-11 DIAGNOSIS — I10 Essential (primary) hypertension: Secondary | ICD-10-CM

## 2024-04-11 DIAGNOSIS — E785 Hyperlipidemia, unspecified: Secondary | ICD-10-CM

## 2024-04-11 DIAGNOSIS — F419 Anxiety disorder, unspecified: Secondary | ICD-10-CM

## 2024-04-11 MED ORDER — DILTIAZEM CD 180 MG CAPSULE,EXTENDED RELEASE 24 HR: 180.0000 mg | ORAL_CAPSULE | Freq: Every day | ORAL | 3 refills | Status: AC

## 2024-04-11 NOTE — Progress Notes (Signed)
 [facility]     [place], [place], 79 y.o. female  Date of Service: 04/11/2024  Date of Birth:  [birth year]  PCP:  Manus Endo, MD  Chief Complaint   Patient presents with    Follow Up    Hypertension     PVD        HPI:    The patient has no history of CAD.  Stress test and echocardiogram in February 2022 are unremarkable.  Echocardiogram showed LVH, mild LAE, EF 60 to 65% with grade 1 diastolic dysfunction.  Mild to moderate aortic regurgitation, mild PR, TR, and moderate MR.     11/08/2023 The patient presents for follow-up hypertension, mitral insufficiency, aortic insufficiency. Her 12/2021 echocardiogram showed concentric hypertrophy, EF 60%, no wall motion abnormalities.  Mildly dilated left atrium, mild mitral regurg no evidence of stenosis, no aortic valve stenosis no aortic regurg.  She denies any chest pain shortness for breath syncope or palpitations.  The blood pressure is elevated today.     11/20/23 The patient is here for f/u from blood pressure issues.  She was told to increase her diltiazem  last visit, but at home her blood pressures were still fluctuating and she was concerned with the higher dose.  She seems very anxious today, after discussing further her husband just recently passed 3 weeks ago.  She feels at fault for the issues after we discussed this at length.  I gave her a lot of reassurance, but I feel like this is contributing to her blood pressure issues.       12/26/23 The patient is here for f/u for echo results.  Echo 12/14/23 showed preserved EF with mild to moderate MR.  She is still having some blood pressure fluctuations, but after discussing further, sounds like she has some inconsistencies with taking her medications.  Overall, it sounds like her numbers are stable.  At times, she reports some lower numbers on the bottom.  Her son is with her today and it sounds like she is also having some memory issues as well.  She has an appointment to see Dr.  IVAR Galloway, but has not seen him yet and he is a little apprehensive.    04/11/24 Patient here today for short follow up.  She has been having a lot of issues with her blood pressure running high at home.  Son is with her today and states there has been some confusion about her medications.  She actually has a bottle of Cardizem  120 mg that was written to be taking 2 capsules once daily however patient has been taking that has 1 capsule once daily for several years.  Her losartan was changed to telmisartan  last visit.  Patient admits she has been very stressed ever since the passing of her husband.  She appears very anxious today during exam.  BP elevated today at 166/67.  She states that this is very similar to the numbers she has been seeing at home.She has been checking it 5-7 times daily at home    EKG: Sinus rhythm at 63. No acute ischemic changes   LAB: No recent lab work  Last BMP  (Last result in the past 2 years)        Na   K   Cl   CO2   BUN   Cr   Calcium   Glucose   Glucose-Fasting        10/10/22 1134 138   3.9   106  27   17   0.85   9.5   89                  Past Medical History:   Diagnosis Date    Esophageal reflux     Essential hypertension     GERD (gastroesophageal reflux disease)     HLD (hyperlipidemia)     Mitral valve regurgitation     Peripheral vascular disease, unspecified (CMS HCC)        Past Surgical History:   Procedure Laterality Date    HX CATARACT REMOVAL Bilateral     HX HIP REPLACEMENT      HX HYSTERECTOMY      HX SEPTOPLASTY      VENOUS THROMBECTOMY         Current Outpatient Medications   Medication Sig    ascorbic acid (VITAMIN C) 1,000 mg Oral Tablet Take 1 Tablet (1,000 mg total) by mouth Daily    atorvastatin (LIPITOR) 20 mg Oral Tablet 1 Tablet (20 mg total) Daily    Benzonatate (TESSALON) 200 mg Oral Capsule Take 1 Capsule (200 mg total) by mouth Three times a day as needed    calcium carbonate/vitamin D3 (CALCIUM 600 + D ORAL) Take by mouth Once a day     ciprofloxacin -dexAMETHasone  (CIPRODEX ) 0.3-0.1 % Otic Drops, Suspension Administer 4 Drops into the right ear Twice daily Instill 4 drops into right ear twice daily (Patient not taking: Reported on 12/26/2023)    COQ10, UBIQUINOL, ORAL Take by mouth Once a day    dilTIAZem  (CARDIZEM  CD) 120 mg Oral Capsule, Sust. Release 24 hr Take 1 Capsule (120 mg total) by mouth Daily    famotidine  (PEPCID ) 40 mg Oral Tablet Take 1 Tablet (40 mg total) by mouth Twice daily (Patient taking differently: Take 1 Tablet (40 mg total) by mouth Every evening)    HYDROcodone -acetaminophen  (NORCO) 7.5-325 mg Oral Tablet Take 1 Tablet by mouth Every 6 hours as needed for Pain    ipratropium bromide  (ATROVENT ) 21 mcg (0.03 %) Nasal nasal spray Administer 2 Sprays into affected nostril(s) Twice per day as needed    loratadine (CLARITIN) 10 mg Oral Tablet Take 1 Tablet (10 mg total) by mouth Once per day as needed    montelukast (SINGULAIR) 10 mg Oral Tablet Take 1 Tablet (10 mg total) by mouth Once per day as needed    mv-min/iron/folic/calcium/vitK (WOMEN'S MULTIVITAMIN ORAL) Take 1 Tablet by mouth Daily    polycarbophil calcium (FIBERCON) 625 mg Oral Tablet Take 1 Tablet (625 mg total) by mouth Daily    telmisartan  (MICARDIS ) 80 mg Oral Tablet Take 1 Tablet (80 mg total) by mouth Daily     ROS: Other than issues noted in HPI, all other systems were negative.     Exam:  Vitals:    04/11/24 1532   Height: 1.6 m (5' 3)       General: No acute distress and appears stated age.    HEENT:Head normocephalic, atraumatic. ENT without erythema or injection, mucouse membranes moist.    Neck: No JVD, no carotid bruit. and supple, symmetrical, trachea midline.   Lungs: Clear to auscultation bilaterally.    Cardiovascular: Regular rate and rhythm, normal S1 S2, no murmur, no rub, or gallop, no thrill     Abdomen: Soft, non-tender and bowel sounds normal.    Extremities: Extremities normal, atraumatic, no cyanosis or edema.    Skin: Skin warm and dry.     Neurologic: Alert and oriented  x3.  Psych: Mood and affect congruent for age and gender     Orders placed this visit:  Orders Placed This Encounter    ECG 12 Lead w/ Interp (MUSE  - In Clinic, Same Day)       Assessment/Plan:  Hypertension    Hyperlipidemia    Anxiety    BP not controlled  Instructed to check her BP once daily  Increase cardizem  to 180 mg daily   Continue Lipitor 20 mg daily   Continue telmisartan  80 mg daily  She is to keep log of BP and call in the next couples of weeks to review numbers  Follow up in 3 months     Jad Johansson MARLA Glance, PA-C 04/11/2024 15:43

## 2024-05-15 ENCOUNTER — Ambulatory Visit

## 2024-07-10 ENCOUNTER — Ambulatory Visit (INDEPENDENT_AMBULATORY_CARE_PROVIDER_SITE_OTHER): Payer: Self-pay | Admitting: PHYSICIAN ASSISTANT

## 2024-11-06 ENCOUNTER — Ambulatory Visit (INDEPENDENT_AMBULATORY_CARE_PROVIDER_SITE_OTHER): Payer: Self-pay | Admitting: PHYSICIAN ASSISTANT
